# Patient Record
Sex: Male | Born: 1965 | Race: Black or African American | Hispanic: No | Smoking: Current every day smoker
Health system: Southern US, Community
[De-identification: ages and names within clinical notes are randomized; demographics above are authoritative.]

## PROBLEM LIST (undated history)

## (undated) DIAGNOSIS — G709 Myoneural disorder, unspecified: Secondary | ICD-10-CM

## (undated) DIAGNOSIS — E785 Hyperlipidemia, unspecified: Secondary | ICD-10-CM

## (undated) DIAGNOSIS — M19011 Primary osteoarthritis, right shoulder: Secondary | ICD-10-CM

## (undated) DIAGNOSIS — M1990 Unspecified osteoarthritis, unspecified site: Secondary | ICD-10-CM

## (undated) DIAGNOSIS — M549 Dorsalgia, unspecified: Secondary | ICD-10-CM

## (undated) DIAGNOSIS — G4730 Sleep apnea, unspecified: Secondary | ICD-10-CM

## (undated) DIAGNOSIS — K219 Gastro-esophageal reflux disease without esophagitis: Secondary | ICD-10-CM

## (undated) DIAGNOSIS — G43909 Migraine, unspecified, not intractable, without status migrainosus: Secondary | ICD-10-CM

## (undated) DIAGNOSIS — F329 Major depressive disorder, single episode, unspecified: Secondary | ICD-10-CM

## (undated) DIAGNOSIS — F419 Anxiety disorder, unspecified: Secondary | ICD-10-CM

## (undated) DIAGNOSIS — F258 Other schizoaffective disorders: Secondary | ICD-10-CM

## (undated) DIAGNOSIS — J45909 Unspecified asthma, uncomplicated: Secondary | ICD-10-CM

## (undated) DIAGNOSIS — F99 Mental disorder, not otherwise specified: Secondary | ICD-10-CM

## (undated) DIAGNOSIS — I10 Essential (primary) hypertension: Secondary | ICD-10-CM

## (undated) DIAGNOSIS — J302 Other seasonal allergic rhinitis: Secondary | ICD-10-CM

## (undated) DIAGNOSIS — F259 Schizoaffective disorder, unspecified: Secondary | ICD-10-CM

## (undated) DIAGNOSIS — F32A Depression, unspecified: Secondary | ICD-10-CM

## (undated) HISTORY — DX: Unspecified asthma, uncomplicated: J45.909

## (undated) HISTORY — DX: Migraine, unspecified, not intractable, without status migrainosus: G43.909

## (undated) HISTORY — DX: Mental disorder, not otherwise specified: F99

## (undated) HISTORY — DX: Other schizoaffective disorders: F25.8

## (undated) HISTORY — DX: Major depressive disorder, single episode, unspecified: F32.9

## (undated) HISTORY — DX: Depression, unspecified: F32.A

## (undated) HISTORY — PX: CARPAL TUNNEL RELEASE: SHX101

## (undated) HISTORY — DX: Schizoaffective disorder, unspecified: F25.9

## (undated) HISTORY — PX: SHOULDER ARTHROSCOPY W/ ROTATOR CUFF REPAIR: SHX2400

---
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
## 1998-01-20 HISTORY — PX: BACK SURGERY: SHX140

## 1999-04-17 ENCOUNTER — Ambulatory Visit (HOSPITAL_COMMUNITY): Admission: RE | Admit: 1999-04-17 | Discharge: 1999-04-17 | Payer: Self-pay | Admitting: Specialist

## 1999-04-17 ENCOUNTER — Encounter: Payer: Self-pay | Admitting: Specialist

## 1999-05-08 ENCOUNTER — Encounter: Admission: RE | Admit: 1999-05-08 | Discharge: 1999-08-06 | Payer: Self-pay | Admitting: Anesthesiology

## 1999-06-05 ENCOUNTER — Encounter: Payer: Self-pay | Admitting: Specialist

## 1999-06-05 ENCOUNTER — Observation Stay (HOSPITAL_COMMUNITY): Admission: RE | Admit: 1999-06-05 | Discharge: 1999-06-06 | Payer: Self-pay | Admitting: Specialist

## 2000-03-25 ENCOUNTER — Encounter: Admission: RE | Admit: 2000-03-25 | Discharge: 2000-03-25 | Payer: Self-pay | Admitting: Specialist

## 2000-03-25 ENCOUNTER — Encounter: Payer: Self-pay | Admitting: Specialist

## 2000-07-03 ENCOUNTER — Emergency Department (HOSPITAL_COMMUNITY): Admission: EM | Admit: 2000-07-03 | Discharge: 2000-07-03 | Payer: Self-pay | Admitting: Emergency Medicine

## 2000-07-03 ENCOUNTER — Encounter: Payer: Self-pay | Admitting: Emergency Medicine

## 2008-01-21 HISTORY — PX: SHOULDER ARTHROSCOPY W/ ROTATOR CUFF REPAIR: SHX2400

## 2012-01-07 ENCOUNTER — Other Ambulatory Visit: Payer: Self-pay | Admitting: Orthopedic Surgery

## 2012-01-08 ENCOUNTER — Encounter (HOSPITAL_BASED_OUTPATIENT_CLINIC_OR_DEPARTMENT_OTHER): Payer: Self-pay | Admitting: *Deleted

## 2012-01-08 NOTE — Progress Notes (Signed)
Pt poor histiorian-sees dr [facility], now [facility],had surgery [facility] 2010- [facility], called  hospital Called [facility] To bring all meds, overnight bag, cpap

## 2012-01-12 ENCOUNTER — Ambulatory Visit (HOSPITAL_BASED_OUTPATIENT_CLINIC_OR_DEPARTMENT_OTHER): Payer: Medicare Other | Admitting: Anesthesiology

## 2012-01-12 ENCOUNTER — Encounter (HOSPITAL_BASED_OUTPATIENT_CLINIC_OR_DEPARTMENT_OTHER)
Admission: RE | Disposition: A | Discharge status: Home / Self Care | Payer: Self-pay | Source: Ambulatory Visit | Attending: Orthopedic Surgery

## 2012-01-12 ENCOUNTER — Ambulatory Visit (HOSPITAL_BASED_OUTPATIENT_CLINIC_OR_DEPARTMENT_OTHER)
Admission: RE | Admit: 2012-01-12 | Discharge: 2012-01-12 | Disposition: A | Discharge status: Home / Self Care | Payer: Medicare Other | Source: Ambulatory Visit | Attending: Orthopedic Surgery | Admitting: Orthopedic Surgery

## 2012-01-12 ENCOUNTER — Encounter (HOSPITAL_BASED_OUTPATIENT_CLINIC_OR_DEPARTMENT_OTHER): Payer: Self-pay | Admitting: Anesthesiology

## 2012-01-12 ENCOUNTER — Encounter (HOSPITAL_BASED_OUTPATIENT_CLINIC_OR_DEPARTMENT_OTHER): Payer: Self-pay | Admitting: Certified Registered"

## 2012-01-12 DIAGNOSIS — M129 Arthropathy, unspecified: Secondary | ICD-10-CM | POA: Insufficient documentation

## 2012-01-12 DIAGNOSIS — F411 Generalized anxiety disorder: Secondary | ICD-10-CM | POA: Insufficient documentation

## 2012-01-12 DIAGNOSIS — I10 Essential (primary) hypertension: Secondary | ICD-10-CM | POA: Insufficient documentation

## 2012-01-12 DIAGNOSIS — M719 Bursopathy, unspecified: Secondary | ICD-10-CM | POA: Insufficient documentation

## 2012-01-12 DIAGNOSIS — M67919 Unspecified disorder of synovium and tendon, unspecified shoulder: Secondary | ICD-10-CM | POA: Insufficient documentation

## 2012-01-12 DIAGNOSIS — G4730 Sleep apnea, unspecified: Secondary | ICD-10-CM | POA: Insufficient documentation

## 2012-01-12 DIAGNOSIS — K219 Gastro-esophageal reflux disease without esophagitis: Secondary | ICD-10-CM | POA: Insufficient documentation

## 2012-01-12 DIAGNOSIS — E785 Hyperlipidemia, unspecified: Secondary | ICD-10-CM | POA: Insufficient documentation

## 2012-01-12 DIAGNOSIS — M75100 Unspecified rotator cuff tear or rupture of unspecified shoulder, not specified as traumatic: Secondary | ICD-10-CM

## 2012-01-12 HISTORY — DX: Dorsalgia, unspecified: M54.9

## 2012-01-12 HISTORY — DX: Myoneural disorder, unspecified: G70.9

## 2012-01-12 HISTORY — DX: Hyperlipidemia, unspecified: E78.5

## 2012-01-12 HISTORY — DX: Other seasonal allergic rhinitis: J30.2

## 2012-01-12 HISTORY — DX: Gastro-esophageal reflux disease without esophagitis: K21.9

## 2012-01-12 HISTORY — DX: Sleep apnea, unspecified: G47.30

## 2012-01-12 HISTORY — DX: Unspecified osteoarthritis, unspecified site: M19.90

## 2012-01-12 HISTORY — DX: Essential (primary) hypertension: I10

## 2012-01-12 HISTORY — PX: SHOULDER ARTHROSCOPY WITH ROTATOR CUFF REPAIR: SHX5685

## 2012-01-12 HISTORY — DX: Anxiety disorder, unspecified: F41.9

## 2012-01-12 LAB — POCT I-STAT, CHEM 8
BUN: 10 mg/dL (ref 6–23)
Creatinine, Ser: 0.7 mg/dL (ref 0.50–1.35)
Potassium: 4.1 mEq/L (ref 3.5–5.1)
Sodium: 141 mEq/L (ref 135–145)

## 2012-01-12 SURGERY — ARTHROSCOPY, SHOULDER, WITH ROTATOR CUFF REPAIR
Anesthesia: Regional | Site: Shoulder | Laterality: Right | Wound class: Clean

## 2012-01-12 MED ORDER — SUCCINYLCHOLINE CHLORIDE 20 MG/ML IJ SOLN
INTRAMUSCULAR | Status: DC | PRN
  Administered 2012-01-12: 100 mg via INTRAVENOUS

## 2012-01-12 MED ORDER — FENTANYL CITRATE 0.05 MG/ML IJ SOLN
INTRAMUSCULAR | Status: DC | PRN
  Administered 2012-01-12: 50 ug via INTRAVENOUS

## 2012-01-12 MED ORDER — LIDOCAINE HCL (CARDIAC) 20 MG/ML IV SOLN
INTRAVENOUS | Status: DC | PRN
  Administered 2012-01-12: 50 mg via INTRAVENOUS

## 2012-01-12 MED ORDER — FENTANYL CITRATE 0.05 MG/ML IJ SOLN
50.0000 ug | INTRAMUSCULAR | Status: DC | PRN
  Administered 2012-01-12: 100 ug via INTRAVENOUS

## 2012-01-12 MED ORDER — OXYCODONE HCL 5 MG/5ML PO SOLN: 5.0000 mg | Freq: Once | ORAL | Status: DC | PRN

## 2012-01-12 MED ORDER — LACTATED RINGERS IV SOLN
INTRAVENOUS | Status: DC
  Administered 2012-01-12 (×2): via INTRAVENOUS

## 2012-01-12 MED ORDER — BUPIVACAINE-EPINEPHRINE PF 0.5-1:200000 % IJ SOLN
INTRAMUSCULAR | Status: DC | PRN
  Administered 2012-01-12: 30 mL

## 2012-01-12 MED ORDER — MIDAZOLAM HCL 2 MG/2ML IJ SOLN
0.5000 mg | INTRAMUSCULAR | Status: DC | PRN
  Administered 2012-01-12: 2 mg via INTRAVENOUS

## 2012-01-12 MED ORDER — OXYCODONE-ACETAMINOPHEN 5-325 MG PO TABS: 1.0000 | ORAL_TABLET | ORAL | Status: DC | PRN

## 2012-01-12 MED ORDER — LACTATED RINGERS IV SOLN: INTRAVENOUS | Status: DC

## 2012-01-12 MED ORDER — PROPOFOL 10 MG/ML IV BOLUS
INTRAVENOUS | Status: DC | PRN
  Administered 2012-01-12: 200 mg via INTRAVENOUS

## 2012-01-12 MED ORDER — CEFAZOLIN SODIUM-DEXTROSE 2-3 GM-% IV SOLR
INTRAVENOUS | Status: DC | PRN
  Administered 2012-01-12: 2 g via INTRAVENOUS

## 2012-01-12 MED ORDER — HYDROMORPHONE HCL PF 1 MG/ML IJ SOLN: 0.2500 mg | INTRAMUSCULAR | Status: DC | PRN

## 2012-01-12 MED ORDER — DEXAMETHASONE SODIUM PHOSPHATE 4 MG/ML IJ SOLN
INTRAMUSCULAR | Status: DC | PRN
  Administered 2012-01-12: 4 mg via INTRAVENOUS

## 2012-01-12 MED ORDER — ONDANSETRON HCL 4 MG/2ML IJ SOLN
INTRAMUSCULAR | Status: DC | PRN
  Administered 2012-01-12: 4 mg via INTRAVENOUS

## 2012-01-12 MED ORDER — OXYCODONE HCL 5 MG PO TABS: 5.0000 mg | ORAL_TABLET | Freq: Once | ORAL | Status: DC | PRN

## 2012-01-12 SURGICAL SUPPLY — 80 items
ADH SKN CLS APL DERMABOND .7 (GAUZE/BANDAGES/DRESSINGS)
APL SKNCLS STERI-STRIP NONHPOA (GAUZE/BANDAGES/DRESSINGS)
BENZOIN TINCTURE PRP APPL 2/3 (GAUZE/BANDAGES/DRESSINGS) IMPLANT
BLADE SURG 15 STRL LF DISP TIS (BLADE) IMPLANT
BLADE SURG 15 STRL SS (BLADE)
BLADE SURG ROTATE 9660 (MISCELLANEOUS) IMPLANT
BUR OVAL 4.0 (BURR) ×2 IMPLANT
CANISTER OMNI JUG 16 LITER (MISCELLANEOUS) ×2 IMPLANT
CANISTER SUCTION 2500CC (MISCELLANEOUS) IMPLANT
CANNULA 5.75X71 LONG (CANNULA) ×2 IMPLANT
CANNULA TWIST IN 8.25X7CM (CANNULA) IMPLANT
CHLORAPREP W/TINT 26ML (MISCELLANEOUS) ×2 IMPLANT
CLOTH BEACON ORANGE TIMEOUT ST (SAFETY) ×2 IMPLANT
DECANTER SPIKE VIAL GLASS SM (MISCELLANEOUS) IMPLANT
DERMABOND ADVANCED (GAUZE/BANDAGES/DRESSINGS)
DERMABOND ADVANCED .7 DNX12 (GAUZE/BANDAGES/DRESSINGS) IMPLANT
DRAPE INCISE IOBAN 66X45 STRL (DRAPES) ×2 IMPLANT
DRAPE STERI 35X30 U-POUCH (DRAPES) ×2 IMPLANT
DRAPE SURG 17X23 STRL (DRAPES) ×2 IMPLANT
DRAPE U 20/CS (DRAPES) ×2 IMPLANT
DRAPE U-SHAPE 47X51 STRL (DRAPES) ×2 IMPLANT
DRAPE U-SHAPE 76X120 STRL (DRAPES) ×4 IMPLANT
DRSG PAD ABDOMINAL 8X10 ST (GAUZE/BANDAGES/DRESSINGS) ×2 IMPLANT
ELECT REM PT RETURN 9FT ADLT (ELECTROSURGICAL) ×2
ELECTRODE REM PT RTRN 9FT ADLT (ELECTROSURGICAL) ×1 IMPLANT
GAUZE SPONGE 4X4 16PLY XRAY LF (GAUZE/BANDAGES/DRESSINGS) IMPLANT
GAUZE XEROFORM 1X8 LF (GAUZE/BANDAGES/DRESSINGS) ×2 IMPLANT
GLOVE BIO SURGEON STRL SZ7 (GLOVE) ×2 IMPLANT
GLOVE BIO SURGEON STRL SZ7.5 (GLOVE) ×3 IMPLANT
GLOVE BIOGEL M 7.0 STRL (GLOVE) ×1 IMPLANT
GLOVE BIOGEL PI IND STRL 7.0 (GLOVE) ×1 IMPLANT
GLOVE BIOGEL PI IND STRL 7.5 (GLOVE) IMPLANT
GLOVE BIOGEL PI IND STRL 8 (GLOVE) ×2 IMPLANT
GLOVE BIOGEL PI INDICATOR 7.0 (GLOVE) ×1
GLOVE BIOGEL PI INDICATOR 7.5 (GLOVE) ×1
GLOVE BIOGEL PI INDICATOR 8 (GLOVE) ×1
GOWN PREVENTION PLUS XLARGE (GOWN DISPOSABLE) ×3 IMPLANT
NDL 1/2 CIR CATGUT .05X1.09 (NEEDLE) IMPLANT
NDL SCORPION MULTI FIRE (NEEDLE) IMPLANT
NDL SUT 6 .5 CRC .975X.05 MAYO (NEEDLE) IMPLANT
NEEDLE 1/2 CIR CATGUT .05X1.09 (NEEDLE) IMPLANT
NEEDLE MAYO TAPER (NEEDLE)
NEEDLE SCORPION MULTI FIRE (NEEDLE) IMPLANT
NS IRRIG 1000ML POUR BTL (IV SOLUTION) IMPLANT
PACK ARTHROSCOPY DSU (CUSTOM PROCEDURE TRAY) ×2 IMPLANT
PACK BASIN DAY SURGERY FS (CUSTOM PROCEDURE TRAY) ×2 IMPLANT
PENCIL BUTTON HOLSTER BLD 10FT (ELECTRODE) IMPLANT
RESECTOR FULL RADIUS 4.2MM (BLADE) ×2 IMPLANT
SLEEVE SCD COMPRESS KNEE MED (MISCELLANEOUS) ×2 IMPLANT
SLING ARM FOAM STRAP LRG (SOFTGOODS) IMPLANT
SLING ARM FOAM STRAP MED (SOFTGOODS) IMPLANT
SLING ARM FOAM STRAP XLG (SOFTGOODS) IMPLANT
SLING ARM IMMOBILIZER MED (SOFTGOODS) IMPLANT
SPONGE GAUZE 4X4 12PLY (GAUZE/BANDAGES/DRESSINGS) ×2 IMPLANT
SPONGE LAP 4X18 X RAY DECT (DISPOSABLE) IMPLANT
STRIP CLOSURE SKIN 1/2X4 (GAUZE/BANDAGES/DRESSINGS) IMPLANT
SUCTION FRAZIER TIP 10 FR DISP (SUCTIONS) IMPLANT
SUPPORT WRAP ARM LG (MISCELLANEOUS) IMPLANT
SUT 2 FIBERLOOP 20 STRT BLUE (SUTURE)
SUT BONE WAX W31G (SUTURE) IMPLANT
SUT ETHILON 3 0 PS 1 (SUTURE) ×2 IMPLANT
SUT FIBERWIRE #2 38 T-5 BLUE (SUTURE)
SUT MNCRL AB 3-0 PS2 18 (SUTURE) IMPLANT
SUT MNCRL AB 4-0 PS2 18 (SUTURE) IMPLANT
SUT PDS AB 0 CT 36 (SUTURE) IMPLANT
SUT PROLENE 3 0 PS 2 (SUTURE) IMPLANT
SUT VIC AB 0 CT1 18XCR BRD 8 (SUTURE) IMPLANT
SUT VIC AB 0 CT1 8-18 (SUTURE)
SUT VIC AB 2-0 SH 18 (SUTURE) IMPLANT
SUTURE 2 FIBERLOOP 20 STRT BLU (SUTURE) IMPLANT
SUTURE FIBERWR #2 38 T-5 BLUE (SUTURE) IMPLANT
SYR BULB 3OZ (MISCELLANEOUS) IMPLANT
TOWEL OR 17X24 6PK STRL BLUE (TOWEL DISPOSABLE) ×2 IMPLANT
TOWEL OR NON WOVEN STRL DISP B (DISPOSABLE) ×2 IMPLANT
TUBE CONNECTING 20X1/4 (TUBING) ×2 IMPLANT
TUBING ARTHROSCOPY IRRIG 16FT (MISCELLANEOUS) ×2 IMPLANT
WAND SHORT BEVEL W/CORD (SURGICAL WAND) IMPLANT
WAND STAR VAC 90 (SURGICAL WAND) ×2 IMPLANT
WATER STERILE IRR 1000ML POUR (IV SOLUTION) ×1 IMPLANT
YANKAUER SUCT BULB TIP NO VENT (SUCTIONS) IMPLANT

## 2012-01-12 NOTE — Anesthesia Preprocedure Evaluation (Signed)
Anesthesia Evaluation  Patient identified by MRN, date of birth, ID band Patient awake    Reviewed: Allergy & Precautions, H&P , NPO status , Patient's Chart, lab work & pertinent test results  Airway Mallampati: II TM Distance: >3 FB Neck ROM: Full    Dental No notable dental hx. (+) Poor Dentition, Dental Advisory Given and Edentulous Lower   Pulmonary sleep apnea and Continuous Positive Airway Pressure Ventilation ,  breath sounds clear to auscultation  Pulmonary exam normal       Cardiovascular hypertension, On Medications Rhythm:Regular Rate:Normal     Neuro/Psych negative neurological ROS  negative psych ROS   GI/Hepatic negative GI ROS, Neg liver ROS, GERD-  Medicated and Controlled,  Endo/Other  Morbid obesity  Renal/GU negative Renal ROS  negative genitourinary   Musculoskeletal   Abdominal   Peds  Hematology negative hematology ROS (+)   Anesthesia Other Findings   Reproductive/Obstetrics negative OB ROS                           Anesthesia Physical Anesthesia Plan  ASA: III  Anesthesia Plan: General and Regional   Post-op Pain Management:    Induction: Intravenous  Airway Management Planned: Oral ETT  Additional Equipment:   Intra-op Plan:   Post-operative Plan: Extubation in OR  Informed Consent: I have reviewed the patients History and Physical, chart, labs and discussed the procedure including the risks, benefits and alternatives for the proposed anesthesia with the patient or authorized representative who has indicated his/her understanding and acceptance.   Dental advisory given  Plan Discussed with: CRNA  Anesthesia Plan Comments:         Anesthesia Quick Evaluation

## 2012-01-12 NOTE — Anesthesia Postprocedure Evaluation (Signed)
  Anesthesia Post-op Note  Patient: Danny Mcdowell  Procedure(s) Performed: Procedure(s) (LRB) with comments: SHOULDER ARTHROSCOPY WITH ROTATOR CUFF REPAIR (Right) - RIGHT SHOULDER ARTHROSCOPIC RIGHT ROTATOR REPAIR DEBRIDEMENT  AND SUBACROMIAL DECOMPRESSION   Patient Location: PACU  Anesthesia Type:GA combined with regional for post-op pain  Level of Consciousness: awake, alert  and oriented  Airway and Oxygen Therapy: Patient Spontanous Breathing and Patient connected to face mask oxygen  Post-op Pain: none  Post-op Assessment: Post-op Vital signs reviewed, Patient's Cardiovascular Status Stable, Respiratory Function Stable, Patent Airway and No signs of Nausea or vomiting  Post-op Vital Signs: Reviewed and stable  Complications: No apparent anesthesia complications

## 2012-01-12 NOTE — Transfer of Care (Signed)
Immediate Anesthesia Transfer of Care Note  Patient: Danny Mcdowell  Procedure(s) Performed: Procedure(s) (LRB) with comments: SHOULDER ARTHROSCOPY WITH ROTATOR CUFF REPAIR (Right) - RIGHT SHOULDER ARTHROSCOPIC RIGHT ROTATOR REPAIR DEBRIDEMENT  AND SUBACROMIAL DECOMPRESSION   Patient Location: PACU  Anesthesia Type:GA combined with regional for post-op pain  Level of Consciousness: awake, alert , oriented and patient cooperative  Airway & Oxygen Therapy: Patient Spontanous Breathing and Patient connected to face mask oxygen  Post-op Assessment: Report given to PACU RN and Post -op Vital signs reviewed and stable  Post vital signs: Reviewed and stable  Complications: No apparent anesthesia complications

## 2012-01-12 NOTE — Progress Notes (Signed)
Assisted Dr. Fitzgerald with right, ultrasound guided, interscalene  block. Side rails up, monitors on throughout procedure. See vital signs in flow sheet. Tolerated Procedure well. 

## 2012-01-12 NOTE — Op Note (Signed)
Procedure(s): SHOULDER ARTHROSCOPY WITH ROTATOR CUFF REPAIR Procedure Note  Danny Mcdowell male 46 y.o. 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         Condition: stable    Procedure:   INDICATIONS FOR SURGERY: The patient is 46 y.o. male who has had a long history of right shoulder problems. He underwent a right shoulder rotator cuff repair back in 2005. He has had a recent exacerbation of his pain with studies which showed recurrent retracted rotator cuff tear with associated atrophy.  He had extensive conservative management with physical therapy and injection therapy when on to have continued pain and mechanical symptoms in the shoulder. He wished to go forward with arthroscopic examination and debridement versus repair of the tear a possible understanding that the tear was not likely repairable. In the case of irreparable tear we also talked about the possibility of a biceps tenotomy for pain relief.  OPERATIVE FINDINGS: Examination under anesthesia: No stiffness or instability. Diagnostic Arthroscopy:  Glenoid articular cartilage: Intact Humeral head articular  cartilage: Intact Labrum: He had some partial elevation of the superior labrum and biceps root. The long head biceps that longitudinal tearing and pulled into the joint. Loose bodies: None Synovitis: Mild Rotator cuff: Supraspinatus was completely torn and retracted to the level of the glenoid. After releases the cuff edge was not mobile enough to come over to the tuberosity for repair. The cuff edge was debrided. Several sutures were removed. The tuberosity was debrided. Coracoacromial ligament: Frayed, underlying large hooked anterior acromion taking down the standard acromioplasty creating a type I acromion.  DESCRIPTION OF PROCEDURE: The patient was identified in preoperative  holding area where I personally marked the operative site after  verifying site, side, and procedure with the patient. An interscalene block was given by the attending anesthesiologist the holding area.  The patient was taken back to the operating room where general anesthesia was induced without complication and was placed in the beach-chair position with the back  elevated about 60 degrees and all extremities and head and neck carefully padded and  positioned.   The right upper extremity was then prepped and  draped in a standard sterile fashion. The appropriate time-out  procedure was carried out. The patient did receive IV antibiotics  within 30 minutes of incision.   A small posterior portal incision was made and the arthroscope was introduced into the joint. An anterior portal was then established above the subscapularis using needle localization. Small cannula was placed anteriorly. Diagnostic arthroscopy was then carried out with findings as described above.  The superior labrum was noted to be elevated off the superior glenoid tubercle. The biceps tendon was pulled in the joint and had a longitudinal tearing which was felt to be a potential source of pain associated with this irreparable rotator cuff tear.  Therefore a large biter was used through the anterior portal to release the long head biceps tendon off of the superior labrum. The superior labrum was then debrided to stable healthy appearing labrum. The subscapularis was intact. The supraspinatus tendon was noted to be torn and retracted to the level of the glenoid. There were a few strands of tissue bridging and Which were not structural and were taken down. The posterior cuff was intact.  The arthroscope was then introduced into the subacromial space a standard lateral portal was established with needle localization. The shaver was used through the lateral portal to perform extensive bursectomy.The coracoacromial arch showed significant fraying indicating chronic impingement in this region.  A capsular release was carried out beneath the supraspinatus above the superior labrum and then the adhesions in the subacromial space were released. A grasper was used to try and grasp the cuff edge. It was determined at this point there is some sutures remaining in the edge of the cuff which removed a large biter. Once back to healthy cuff tissue the grasper was used to try and bring the cuff edge over the tuberosity and it could not be brought even halfway. It was deemed at this point to be irreparable. The tuberosity was debrided.  The coracoacromial ligament was taken down off the anterior acromion with the ArthroCare exposing a large hooked anterior acromial spur. A high-speed bur was then used through the lateral portal to take down the anterior acromial spur from lateral to medial in a standard acromioplasty.  The acromioplasty was also viewed from the lateral portal and the bur was used as necessary in a cutting block fashion to ensure that the acromion was completely flat from posterior to anterior.  The arthroscopic equipment was removed from the joint and the portals were closed with 3-0 nylon in an interrupted fashion. Sterile dressings were then applied  including Xeroform 4 x 4's ABDs and tape. The patient was then allowed to awaken from general anesthesia, placed in a sling, transferred to the stretcher and taken to the recovery room in stable condition.   POSTOPERATIVE PLAN: The patient will be discharged home today and will followup in one week for suture removal and wound check.  He can begin some gentle range of motion exercises when comfortable.

## 2012-01-12 NOTE — H&P (Signed)
Danny Mcdowell is an 46 y.o. male.   Chief Complaint: R shoulder pain HPI: s/p RCR R shoulder in 2005 with recent severe increase in pain.  MRI shows recurrent RCR with atrophy.  Patient failed non op tx with injections and PT, wished to go ahead with arthroscopic debridement for pain relief.  Past Medical History  Diagnosis Date  . Hypertension   . Hyperlipemia   . Arthritis   . Sleep apnea     uses a cpap  . Back pain   . Neuromuscular disorder     numbness lt leg  . Anxiety   . Seasonal allergies   . GERD (gastroesophageal reflux disease)     Past Surgical History  Procedure Date  . Shoulder arthroscopy w/ rotator cuff repair     right x2  . Shoulder arthroscopy w/ rotator cuff repair 2010    left  . Carpal tunnel release     right and left  . Knee arthroscopy 1987    left  . Back surgery 2000    lumb disk    History reviewed. No pertinent family history. Social History:  reports that he has been smoking.  He does not have any smokeless tobacco history on file. He reports that he does not drink alcohol or use illicit drugs.  Allergies: No Known Allergies  Medications Prior to Admission  Medication Sig Dispense Refill  . clonazePAM (KLONOPIN) 0.5 MG tablet Take 0.5 mg by mouth 2 (two) times daily as needed.      . ezetimibe-simvastatin (VYTORIN) 10-20 MG per tablet Take 1 tablet by mouth at bedtime.      . hydrochlorothiazide (HYDRODIURIL) 25 MG tablet Take 25 mg by mouth daily.      Marland Kitchen loratadine (CLARITIN) 10 MG tablet Take 10 mg by mouth daily.      Marland Kitchen omeprazole (PRILOSEC) 20 MG capsule Take 20 mg by mouth 2 (two) times daily.      . traMADol (ULTRAM) 50 MG tablet Take 50 mg by mouth every 6 (six) hours as needed.        No results found for this or any previous visit (from the past 48 hour(s)). No results found.  Review of Systems  All other systems reviewed and are negative.    Blood pressure 126/88, pulse 77, temperature 98.2 F (36.8 C),  temperature source Oral, resp. rate 20, height 5\' 7"  (1.702 m), weight 117.482 kg (259 lb), SpO2 95.00%. Physical Exam  Constitutional: He is oriented to person, place, and time. He appears well-developed and well-nourished.  HENT:  Head: Atraumatic.  Eyes: EOM are normal.  Cardiovascular: Intact distal pulses.   Respiratory: Effort normal.  Musculoskeletal:       Right shoulder: He exhibits decreased range of motion, tenderness and pain.  Neurological: He is alert and oriented to person, place, and time.  Skin: Skin is warm and dry.  Psychiatric: He has a normal mood and affect.     Assessment/Plan R recurrent RCR, with pain and unfavorable acromial anatomy. Plan arth debridement vs repair with acromioplasty Risks / benefits of surgery discussed Consent on chart  NPO for OR Preop antibiotics   Jaquawn Saffran WILLIAM 01/12/2012, 2:43 PM

## 2012-01-12 NOTE — Anesthesia Procedure Notes (Addendum)
Anesthesia Regional Block:  Interscalene brachial plexus block  Pre-Anesthetic Checklist: ,, timeout performed, Correct Patient, Correct Site, Correct Laterality, Correct Procedure, Correct Position, site marked, Risks and benefits discussed, pre-op evaluation,  At surgeon's request and post-op pain management  Laterality: Right  Prep: Maximum Sterile Barrier Precautions used and chloraprep       Needles:  Injection technique: Single-shot  Needle Type: Echogenic Stimulator Needle     Needle Length: 5cm 5 cm Needle Gauge: 22 and 22 G    Additional Needles:  Procedures: ultrasound guided (picture in chart) and nerve stimulator Interscalene brachial plexus block  Nerve Stimulator or Paresthesia:  Response: Biceps response,   Additional Responses:   Narrative:  Start time: 01/12/2012 3:00 PM End time: 01/12/2012 3:13 PM Injection made incrementally with aspirations every 5 mL. Anesthesiologist: Sampson Goon, MD  Additional Notes: 2% Lidocaine skin wheel.   Interscalene brachial plexus block Procedure Name: Intubation Date/Time: 01/12/2012 3:25 PM Performed by: Zenia Resides D Pre-anesthesia Checklist: Patient identified, Emergency Drugs available, Suction available and Patient being monitored Patient Re-evaluated:Patient Re-evaluated prior to inductionOxygen Delivery Method: Circle System Utilized Preoxygenation: Pre-oxygenation with 100% oxygen Intubation Type: IV induction Ventilation: Mask ventilation without difficulty and Oral airway inserted - appropriate to patient size Grade View: Grade II Tube type: Oral Tube size: 8.0 mm Number of attempts: 1 Airway Equipment and Method: stylet,  oral airway and Video-laryngoscopy Placement Confirmation: ETT inserted through vocal cords under direct vision,  positive ETCO2 and breath sounds checked- equal and bilateral Secured at: 24 cm Tube secured with: Tape Dental Injury: Teeth and Oropharynx as per pre-operative assessment   Difficulty Due To: Difficult Airway- due to dentition

## 2012-01-16 ENCOUNTER — Encounter (HOSPITAL_BASED_OUTPATIENT_CLINIC_OR_DEPARTMENT_OTHER): Payer: Self-pay | Admitting: Orthopedic Surgery

## 2013-01-20 HISTORY — PX: NOSE SURGERY: SHX723

## 2015-06-27 ENCOUNTER — Encounter: Payer: Self-pay | Admitting: Emergency Medicine

## 2015-06-27 ENCOUNTER — Emergency Department
Admission: EM | Admit: 2015-06-27 | Discharge: 2015-06-27 | Disposition: A | Discharge status: Home / Self Care | Payer: Medicare Other | Attending: Emergency Medicine | Admitting: Emergency Medicine

## 2015-06-27 ENCOUNTER — Emergency Department: Payer: Medicare Other

## 2015-06-27 DIAGNOSIS — F17200 Nicotine dependence, unspecified, uncomplicated: Secondary | ICD-10-CM | POA: Insufficient documentation

## 2015-06-27 DIAGNOSIS — R109 Unspecified abdominal pain: Secondary | ICD-10-CM | POA: Insufficient documentation

## 2015-06-27 DIAGNOSIS — I10 Essential (primary) hypertension: Secondary | ICD-10-CM | POA: Insufficient documentation

## 2015-06-27 DIAGNOSIS — M1990 Unspecified osteoarthritis, unspecified site: Secondary | ICD-10-CM | POA: Insufficient documentation

## 2015-06-27 DIAGNOSIS — Z79899 Other long term (current) drug therapy: Secondary | ICD-10-CM | POA: Insufficient documentation

## 2015-06-27 DIAGNOSIS — E785 Hyperlipidemia, unspecified: Secondary | ICD-10-CM | POA: Diagnosis not present

## 2015-06-27 DIAGNOSIS — K4090 Unilateral inguinal hernia, without obstruction or gangrene, not specified as recurrent: Secondary | ICD-10-CM | POA: Diagnosis not present

## 2015-06-27 LAB — CBC
HCT: 39.4 % — ABNORMAL LOW (ref 40.0–52.0)
Hemoglobin: 13.6 g/dL (ref 13.0–18.0)
MCH: 30.8 pg (ref 26.0–34.0)
MCHC: 34.6 g/dL (ref 32.0–36.0)
MCV: 89 fL (ref 80.0–100.0)
Platelets: 259 10*3/uL (ref 150–440)
RBC: 4.43 MIL/uL (ref 4.40–5.90)
RDW: 13.9 % (ref 11.5–14.5)
WBC: 6.6 10*3/uL (ref 3.8–10.6)

## 2015-06-27 LAB — COMPREHENSIVE METABOLIC PANEL
ALT: 17 U/L (ref 17–63)
AST: 21 U/L (ref 15–41)
Albumin: 4.3 g/dL (ref 3.5–5.0)
Alkaline Phosphatase: 39 U/L (ref 38–126)
Anion gap: 5 (ref 5–15)
BILIRUBIN TOTAL: 0.6 mg/dL (ref 0.3–1.2)
BUN: 16 mg/dL (ref 6–20)
CALCIUM: 9.1 mg/dL (ref 8.9–10.3)
CHLORIDE: 107 mmol/L (ref 101–111)
CO2: 25 mmol/L (ref 22–32)
CREATININE: 0.95 mg/dL (ref 0.61–1.24)
Glucose, Bld: 101 mg/dL — ABNORMAL HIGH (ref 65–99)
Potassium: 4.1 mmol/L (ref 3.5–5.1)
Sodium: 137 mmol/L (ref 135–145)
TOTAL PROTEIN: 7.6 g/dL (ref 6.5–8.1)

## 2015-06-27 LAB — URINALYSIS COMPLETE WITH MICROSCOPIC (ARMC ONLY)
Bacteria, UA: NONE SEEN
Bilirubin Urine: NEGATIVE
Glucose, UA: NEGATIVE mg/dL
Hgb urine dipstick: NEGATIVE
Ketones, ur: NEGATIVE mg/dL
Leukocytes, UA: NEGATIVE
Nitrite: NEGATIVE
Protein, ur: NEGATIVE mg/dL
Specific Gravity, Urine: 1.02 (ref 1.005–1.030)
Squamous Epithelial / LPF: NONE SEEN
WBC, UA: NONE SEEN WBC/hpf (ref 0–5)
pH: 6 (ref 5.0–8.0)

## 2015-06-27 LAB — LIPASE, BLOOD: LIPASE: 20 U/L (ref 11–51)

## 2015-06-27 MED ORDER — IBUPROFEN 800 MG PO TABS: 800.0000 mg | ORAL_TABLET | Freq: Three times a day (TID) | ORAL | Status: DC | PRN

## 2015-06-27 MED ORDER — CYCLOBENZAPRINE HCL 10 MG PO TABS: 10.0000 mg | ORAL_TABLET | Freq: Three times a day (TID) | ORAL | Status: DC | PRN

## 2015-06-27 MED ORDER — OXYCODONE-ACETAMINOPHEN 5-325 MG PO TABS
2.0000 | ORAL_TABLET | Freq: Once | ORAL | Status: AC
  Administered 2015-06-27: 2 via ORAL
  Filled 2015-06-27: qty 2

## 2015-06-27 NOTE — ED Notes (Signed)
Patient presents to the ED with intermittent right side/abdominal pain for the past 5-6 hours.  Patient reports history of similar pain that he states was caused by kidney problems.  Patient reports slight pain on the left flank but much worse pain in his right side.  Patient denies nausea, vomiting, and diarrhea.  Patient states pain was somewhat worse after he ate lunch of barbecue.  Patient is in no obvious distress in triage at this time.

## 2015-06-27 NOTE — Discharge Instructions (Signed)
Flank Pain °Flank pain refers to pain that is located on the side of the body between the upper abdomen and the back. The pain may occur over a short period of time (acute) or may be long-term or reoccurring (chronic). It may be mild or severe. Flank pain can be caused by many things. °CAUSES  °Some of the more common causes of flank pain include: °· Muscle strains.   °· Muscle spasms.   °· A disease of your spine (vertebral disk disease).   °· A lung infection (pneumonia).   °· Fluid around your lungs (pulmonary edema).   °· A kidney infection.   °· Kidney stones.   °· A very painful skin rash caused by the chickenpox virus (shingles).   °· Gallbladder disease.   °HOME CARE INSTRUCTIONS  °Home care will depend on the cause of your pain. In general, °· Rest as directed by your caregiver. °· Drink enough fluids to keep your urine clear or pale yellow. °· Only take over-the-counter or prescription medicines as directed by your caregiver. Some medicines may help relieve the pain. °· Tell your caregiver about any changes in your pain. °· Follow up with your caregiver as directed. °SEEK IMMEDIATE MEDICAL CARE IF:  °· Your pain is not controlled with medicine.   °· You have new or worsening symptoms. °· Your pain increases.   °· You have abdominal pain.   °· You have shortness of breath.   °· You have persistent nausea or vomiting.   °· You have swelling in your abdomen.   °· You feel faint or pass out.   °· You have blood in your urine. °· You have a fever or persistent symptoms for more than 2-3 days. °· You have a fever and your symptoms suddenly get worse. °MAKE SURE YOU:  °· Understand these instructions. °· Will watch your condition. °· Will get help right away if you are not doing well or get worse. °  °This information is not intended to replace advice given to you by your health care provider. Make sure you discuss any questions you have with your health care provider. °  °Document Released: 02/27/2005 Document  Revised: 10/01/2011 Document Reviewed: 08/21/2011 °Elsevier Interactive Patient Education ©2016 [employer] ° °

## 2015-06-27 NOTE — ED Provider Notes (Signed)
[facility] Emergency Department Provider Note        Time seen: ----------------------------------------- 6:44 PM on 06/27/2015 -----------------------------------------    I have reviewed the triage vital signs and the nursing notes.   HISTORY  Chief Complaint Abdominal Pain    HPI Danny Mcdowell is a 50 y.o. male who presents to ER for right-sided flank pain for last 5-6 hours. Patient reports he has a history of same that has been bothering over the last several years that was caused by kidney problems. Occasionally he said pain in the left side but now he has more pain in the right side. Denies nausea, vomiting or diarrhea. Patient states the pain seemed to get worse after a barbecue.   Past Medical History  Diagnosis Date  . Hypertension   . Hyperlipemia   . Arthritis   . Sleep apnea     uses a cpap  . Back pain   . Neuromuscular disorder (HCC)     numbness lt leg  . Anxiety   . Seasonal allergies   . GERD (gastroesophageal reflux disease)     There are no active problems to display for this patient.   Past Surgical History  Procedure Laterality Date  . Shoulder arthroscopy w/ rotator cuff repair      right x2  . Shoulder arthroscopy w/ rotator cuff repair  2010    left  . Carpal tunnel release      right and left  . Knee arthroscopy  1987    left  . Back surgery  2000    lumb disk  . Shoulder arthroscopy with rotator cuff repair  01/12/2012    Procedure: SHOULDER ARTHROSCOPY WITH ROTATOR CUFF REPAIR;  Surgeon: Mable ParisJustin William Chandler, MD;  Location: [facility];  Service: Orthopedics;  Laterality: Right;  RIGHT SHOULDER ARTHROSCOPIC RIGHT ROTATOR REPAIR DEBRIDEMENT  AND SUBACROMIAL DECOMPRESSION     Allergies Review of patient's allergies indicates no known allergies.  Social History Social History  Substance Use Topics  . Smoking status: Current Every Day Smoker -- 1.00 packs/day  . Smokeless tobacco:  None  . Alcohol Use: No     Comment: not since 2010    Review of Systems Constitutional: Negative for fever. Eyes: Negative for visual changes. ENT: Negative for sore throat. Cardiovascular: Negative for chest pain. Respiratory: Negative for shortness of breath. Gastrointestinal: Positive for flank pain Genitourinary: Negative for dysuria. Musculoskeletal: Negative for back pain. Skin: Negative for rash. Neurological: Negative for headaches, focal weakness or numbness.  10-point ROS otherwise negative.  ____________________________________________   PHYSICAL EXAM:  VITAL SIGNS: ED Triage Vitals  Enc Vitals Group     BP 06/27/15 1717 130/83 mmHg     Pulse Rate 06/27/15 1717 72     Resp 06/27/15 1717 18     Temp 06/27/15 1717 98.8 F (37.1 C)     Temp Source 06/27/15 1717 Oral     SpO2 06/27/15 1717 99 %     Weight 06/27/15 1717 230 lb (104.327 kg)     Height 06/27/15 1717 5\' 7"  (1.702 m)     Head Cir --      Peak Flow --      Pain Score 06/27/15 1718 8     Pain Loc --      Pain Edu? --      Excl. in GC? --     Constitutional: Alert and oriented. Well appearing and in no distress. Eyes: Conjunctivae are normal. PERRL.  Normal extraocular movements. ENT   Head: Normocephalic and atraumatic.   Nose: No congestion/rhinnorhea.   Mouth/Throat: Mucous membranes are moist.   Neck: No stridor. Cardiovascular: Normal rate, regular rhythm. No murmurs, rubs, or gallops. Respiratory: Normal respiratory effort without tachypnea nor retractions. Breath sounds are clear and equal bilaterally. No wheezes/rales/rhonchi. Gastrointestinal:  Right flank tenderness, no rebound or guarding. Normal bowel sounds.  Musculoskeletal: Nontender with normal range of motion in all extremities. No lower extremity tenderness nor edema. Neurologic:  Normal speech and language. No gross focal neurologic deficits are appreciated.  Skin:  Skin is warm, dry and intact. No rash  noted. Psychiatric: Mood and affect are normal. Speech and behavior are normal.  ____________________________________________  ED COURSE:  Pertinent labs & imaging results that were available during my care of the patient were reviewed by me and considered in my medical decision making (see chart for details).  patient is in no acute distress, will check basic labs and reevaluate.  ____________________________________________    LABS (pertinent positives/negatives)  Labs Reviewed  COMPREHENSIVE METABOLIC PANEL - Abnormal; Notable for the following:    Glucose, Bld 101 (*)    All other components within normal limits  CBC - Abnormal; Notable for the following:    HCT 39.4 (*)    All other components within normal limits  URINALYSIS COMPLETEWITH MICROSCOPIC ([facility] ONLY) - Abnormal; Notable for the following:    Color, Urine YELLOW (*)    APPearance CLEAR (*)    All other components within normal limits  LIPASE, BLOOD    RADIOLOGY Images were view  CT renal protocol  IMPRESSION: Negative for urinary tract stone. No acute abnormality or finding to explain the patient's symptoms.  Small fat containing right inguinal hernia.  Mild, scattered aortic atherosclerosis. ____________________________________________  FINAL ASSESSMENT AND PLAN:   Flank pain  Plan: Patient with labs and imaging as dictated above. No clear etiology for his pain is identified. He'll be discharged with Motrin and Flexeril and encouraged to have close follow-up with [facility].   Emily Filbert, MD   Note: This dictation was prepared with Dragon dictation. Any transcriptional errors that result from this process are unintentional   Emily Filbert, MD 06/27/15 [phone]

## 2015-06-29 ENCOUNTER — Telehealth: Payer: Self-pay | Admitting: *Deleted

## 2015-06-29 NOTE — Telephone Encounter (Signed)
Pt not available at time of call. He will try to call back this afternoon if he is able.

## 2015-07-02 ENCOUNTER — Ambulatory Visit (INDEPENDENT_AMBULATORY_CARE_PROVIDER_SITE_OTHER): Payer: Medicare Other | Admitting: Physician Assistant

## 2015-07-02 ENCOUNTER — Encounter: Payer: Self-pay | Admitting: Physician Assistant

## 2015-07-02 VITALS — BP 118/80 | HR 65 | Temp 97.8°F | Resp 16 | Ht 67.0 in | Wt 235.4 lb

## 2015-07-02 DIAGNOSIS — I10 Essential (primary) hypertension: Secondary | ICD-10-CM

## 2015-07-02 DIAGNOSIS — I700 Atherosclerosis of aorta: Secondary | ICD-10-CM

## 2015-07-02 DIAGNOSIS — R109 Unspecified abdominal pain: Secondary | ICD-10-CM

## 2015-07-02 DIAGNOSIS — E785 Hyperlipidemia, unspecified: Secondary | ICD-10-CM

## 2015-07-02 DIAGNOSIS — R7303 Prediabetes: Secondary | ICD-10-CM

## 2015-07-02 LAB — URINALYSIS, ROUTINE W REFLEX MICROSCOPIC
BILIRUBIN URINE: NEGATIVE
HGB URINE DIPSTICK: NEGATIVE
KETONES UR: NEGATIVE
LEUKOCYTES UA: NEGATIVE
NITRITE: NEGATIVE
PH: 6.5 (ref 5.0–8.0)
RBC / HPF: NONE SEEN (ref 0–?)
Specific Gravity, Urine: 1.02 (ref 1.000–1.030)
TOTAL PROTEIN, URINE-UPE24: NEGATIVE
URINE GLUCOSE: NEGATIVE
UROBILINOGEN UA: 0.2 (ref 0.0–1.0)
WBC, UA: NONE SEEN (ref 0–?)

## 2015-07-02 LAB — COMPREHENSIVE METABOLIC PANEL
ALBUMIN: 3.9 g/dL (ref 3.5–5.2)
ALK PHOS: 32 U/L — AB (ref 39–117)
ALT: 14 U/L (ref 0–53)
AST: 16 U/L (ref 0–37)
BILIRUBIN TOTAL: 0.6 mg/dL (ref 0.2–1.2)
BUN: 15 mg/dL (ref 6–23)
CO2: 25 mEq/L (ref 19–32)
Calcium: 9.2 mg/dL (ref 8.4–10.5)
Chloride: 107 mEq/L (ref 96–112)
Creatinine, Ser: 0.79 mg/dL (ref 0.40–1.50)
GFR: 134.31 mL/min (ref >60.00)
Glucose, Bld: 105 mg/dL — ABNORMAL HIGH (ref 70–99)
POTASSIUM: 4.1 meq/L (ref 3.5–5.1)
SODIUM: 138 meq/L (ref 135–145)
TOTAL PROTEIN: 7.1 g/dL (ref 6.0–8.3)

## 2015-07-02 LAB — LIPID PANEL
CHOLESTEROL: 173 mg/dL (ref 0–200)
HDL: 37.8 mg/dL — ABNORMAL LOW (ref >39.00)
LDL Cholesterol: 113 mg/dL — ABNORMAL HIGH (ref 0–99)
NONHDL: 135.67
Total CHOL/HDL Ratio: 5
Triglycerides: 112 mg/dL (ref 0.0–149.0)
VLDL: 22.4 mg/dL (ref 0.0–40.0)

## 2015-07-02 LAB — HEMOGLOBIN A1C: HEMOGLOBIN A1C: 5.9 % (ref 4.6–6.5)

## 2015-07-02 MED ORDER — BACLOFEN 10 MG PO TABS: 10.0000 mg | ORAL_TABLET | Freq: Three times a day (TID) | ORAL | Status: DC

## 2015-07-02 NOTE — Patient Instructions (Signed)
Please go to the lab for blood work. I will call with results. We will restart medications based on those results.   I will be requesting records from previous providers for review to get a better understanding of your medical history.   Please continue Ibuprofen as directed when needed for pain.  Take the Baclofen for muscle spasm. No driving while on medication.  Limit heavy lifting. Apply topical Icy Hot to the area. A heating pad may also be beneficial. Follow-up with me in 1 week.

## 2015-07-02 NOTE — Progress Notes (Signed)
Patient presents to clinic today to establish care. Patient is an ER follow-up for R flank pain.  Patient presented to ER on 06/27/15 c/o R flank pain x 6 hours. Workup included labs (overall unremarkable), unremarkable UA and CT Renal Stone Study which was negative for renal stone or other finding to explain symptoms. CT did incidentally reveal a small fat containing R inguinal hernia and aortic atherosclerosis (mild) . Since discharge endorses pain is still present but mild and intermittent. Is exacerbated with movement and alleviated with rest. Denies change to bowel or bladder habits. Denies fever, chills, malaise or fatigue. Denies trauma or injury.   Hypertension -- Patient with history of such. Is currently prescribed Chlorthalidone 25 mg. Endorses he only takes occasionally, probably once per week. Denies history of stroke or heart attack. States he did see a Cardiologist previously due to episode of chest pain. States he had a stress test that he thinks was negative. Patient denies chest pain, palpitations, lightheadedness, dizziness, vision changes or frequent headaches.  BP Readings from Last 3 Encounters:  07/02/15 118/80  06/27/15 119/86  01/12/12 125/85   Hyperlipidemia -- Is prescribed Lipitor 20 mg but states he has not taken in > 30 days. Denies side effect of medication. States he just does not like to take pills. Denies current diet or exercise regimen. Body mass index is 36.86 kg/(m^2).  Pre-diabetes -- Has prescription for Metformin 500 mg BID. States he only takes maybe once a week as well. Denies polyuria, polydipsia or polyphagia. States he was told he had higher sugar but was not told he had true diabetes.   GERD -- mild symptoms at present. Only notes heart burn, worse depending on foods. Is taking OTC Prilosec PRN with good relief.   Sleep Apnea -- Endorses wearing CPAP nightly. Endorses good rest.   Past Medical History  Diagnosis Date  . Hypertension   .  Hyperlipemia   . Arthritis   . Sleep apnea     uses a cpap  . Back pain   . Neuromuscular disorder (HCC)     numbness lt leg  . Anxiety   . Seasonal allergies   . GERD (gastroesophageal reflux disease)     Past Surgical History  Procedure Laterality Date  . Shoulder arthroscopy w/ rotator cuff repair      right x2  . Shoulder arthroscopy w/ rotator cuff repair  2010    left  . Carpal tunnel release      right and left  . Knee arthroscopy  1987    left  . Back surgery  2000    lumb disk  . Shoulder arthroscopy with rotator cuff repair  01/12/2012    Procedure: SHOULDER ARTHROSCOPY WITH ROTATOR CUFF REPAIR;  Surgeon: Nita Sells, MD;  Location: [facility];  Service: Orthopedics;  Laterality: Right;  RIGHT SHOULDER ARTHROSCOPIC RIGHT ROTATOR REPAIR DEBRIDEMENT  AND SUBACROMIAL DECOMPRESSION     Current Outpatient Prescriptions on File Prior to Visit  Medication Sig Dispense Refill  . clonazePAM (KLONOPIN) 0.5 MG tablet Take 0.5 mg by mouth 2 (two) times daily as needed.    . cyclobenzaprine (FLEXERIL) 10 MG tablet Take 1 tablet (10 mg total) by mouth every 8 (eight) hours as needed for muscle spasms. 30 tablet 1  . ezetimibe-simvastatin (VYTORIN) 10-20 MG per tablet Take 1 tablet by mouth at bedtime.    . hydrochlorothiazide (HYDRODIURIL) 25 MG tablet Take 25 mg by mouth daily.    Marland Kitchen  ibuprofen (ADVIL,MOTRIN) 800 MG tablet Take 1 tablet (800 mg total) by mouth every 8 (eight) hours as needed. 30 tablet 0  . loratadine (CLARITIN) 10 MG tablet Take 10 mg by mouth daily.    Marland Kitchen omeprazole (PRILOSEC) 20 MG capsule Take 20 mg by mouth 2 (two) times daily.    Marland Kitchen oxyCODONE-acetaminophen (ROXICET) 5-325 MG per tablet Take 1-2 tablets by mouth every 4 (four) hours as needed for pain. 60 tablet 0  . traMADol (ULTRAM) 50 MG tablet Take 50 mg by mouth every 6 (six) hours as needed.     No current facility-administered medications on file prior to visit.    No Known  Allergies  No family history on file.  Social History   Social History  . Marital Status: Married    Spouse Name: N/A  . Number of Children: N/A  . Years of Education: N/A   Occupational History  . Not on file.   Social History Main Topics  . Smoking status: Current Every Day Smoker -- 1.00 packs/day  . Smokeless tobacco: Not on file  . Alcohol Use: No     Comment: not since 2010  . Drug Use: No  . Sexual Activity: Not on file   Other Topics Concern  . Not on file   Social History Narrative    Review of Systems  Constitutional: Negative for fever and weight loss.  HENT: Negative for ear discharge, ear pain, hearing loss and tinnitus.   Eyes: Negative for blurred vision, double vision, photophobia and pain.  Respiratory: Negative for cough and shortness of breath.   Cardiovascular: Negative for chest pain and palpitations.  Gastrointestinal: Positive for heartburn. Negative for nausea, vomiting, abdominal pain, diarrhea, constipation, blood in stool and melena.  Genitourinary: Negative for dysuria, urgency, frequency, hematuria and flank pain.  Musculoskeletal: Positive for back pain. Negative for falls.  Neurological: Negative for dizziness, loss of consciousness and headaches.  Endo/Heme/Allergies: Negative for environmental allergies.  Psychiatric/Behavioral: Negative for depression, suicidal ideas, hallucinations and substance abuse. The patient is not nervous/anxious and does not have insomnia.     There were no vitals taken for this visit.  Physical Exam  Constitutional: He is oriented to person, place, and time and well-developed, well-nourished, and in no distress.  HENT:  Head: Normocephalic and atraumatic.  Eyes: Conjunctivae are normal.  Neck: Neck supple.  Cardiovascular: Normal rate, regular rhythm, normal heart sounds and intact distal pulses.   Pulmonary/Chest: Effort normal and breath sounds normal. No respiratory distress. He has no wheezes. He has  no rales. He exhibits no tenderness.  Abdominal: Soft. Bowel sounds are normal. He exhibits no distension and no mass. There is no tenderness. There is no rebound and no guarding.  Genitourinary: Penis normal.  Musculoskeletal:       Thoracic back: He exhibits tenderness and spasm. He exhibits no bony tenderness.  Neurological: He is alert and oriented to person, place, and time.  Skin: Skin is warm and dry. No rash noted.  Psychiatric: Affect normal.  Vitals reviewed.   Recent Results (from the past 2160 hour(s))  Lipase, blood     Status: None   Collection Time: 06/27/15  5:20 PM  Result Value Ref Range   Lipase 20 11 - 51 U/L  Comprehensive metabolic panel     Status: Abnormal   Collection Time: 06/27/15  5:20 PM  Result Value Ref Range   Sodium 137 135 - 145 mmol/L   Potassium 4.1 3.5 - 5.1 mmol/L  Chloride 107 101 - 111 mmol/L   CO2 25 22 - 32 mmol/L   Glucose, Bld 101 (H) 65 - 99 mg/dL   BUN 16 6 - 20 mg/dL   Creatinine, Ser 0.95 0.61 - 1.24 mg/dL   Calcium 9.1 8.9 - 10.3 mg/dL   Total Protein 7.6 6.5 - 8.1 g/dL   Albumin 4.3 3.5 - 5.0 g/dL   AST 21 15 - 41 U/L   ALT 17 17 - 63 U/L   Alkaline Phosphatase 39 38 - 126 U/L   Total Bilirubin 0.6 0.3 - 1.2 mg/dL   GFR calc non Af Amer >60 >60 mL/min   GFR calc Af Amer >60 >60 mL/min    Comment: (NOTE) The eGFR has been calculated using the CKD EPI equation. This calculation has not been validated in all clinical situations. eGFR's persistently <60 mL/min signify possible Chronic Kidney Disease.    Anion gap 5 5 - 15  CBC     Status: Abnormal   Collection Time: 06/27/15  5:20 PM  Result Value Ref Range   WBC 6.6 3.8 - 10.6 K/uL   RBC 4.43 4.40 - 5.90 MIL/uL   Hemoglobin 13.6 13.0 - 18.0 g/dL   HCT 39.4 (L) 40.0 - 52.0 %   MCV 89.0 80.0 - 100.0 fL   MCH 30.8 26.0 - 34.0 pg   MCHC 34.6 32.0 - 36.0 g/dL   RDW 13.9 11.5 - 14.5 %   Platelets 259 150 - 440 K/uL  Urinalysis complete, with microscopic     Status:  Abnormal   Collection Time: 06/27/15  5:20 PM  Result Value Ref Range   Color, Urine YELLOW (A) YELLOW   APPearance CLEAR (A) CLEAR   Glucose, UA NEGATIVE NEGATIVE mg/dL   Bilirubin Urine NEGATIVE NEGATIVE   Ketones, ur NEGATIVE NEGATIVE mg/dL   Specific Gravity, Urine 1.020 1.005 - 1.030   Hgb urine dipstick NEGATIVE NEGATIVE   pH 6.0 5.0 - 8.0   Protein, ur NEGATIVE NEGATIVE mg/dL   Nitrite NEGATIVE NEGATIVE   Leukocytes, UA NEGATIVE NEGATIVE   RBC / HPF 0-5 0 - 5 RBC/hpf   WBC, UA NONE SEEN 0 - 5 WBC/hpf   Bacteria, UA NONE SEEN NONE SEEN   Squamous Epithelial / LPF NONE SEEN NONE SEEN   Mucous PRESENT     Assessment/Plan: 1. Pre-diabetes Patient reported. Recent ER non-fasting glucose at 101 despite not taking medications regularly. Patient does endorses he lost 50 pounds a couple of years ago. Question if pre-diabetes or frank diabetes is present. No symptoms. Will hold Metformin for now since he is not taking anyway. Will check lab panel today to include CMP, A1C and UA. Will alter regimen based on results. Dietary and exercise recommendations given.  - Comp Met (CMET) - Lipid panel - Hemoglobin A1c - Urinalysis, Routine w reflex microscopic  2. Aortic atherosclerosis (HCC) Will check lipid panel today. Continue statin as directed. Recommended daily 81 mg ASA. Will consider stress test after reviewing old Cardiology records that have been requested. Will discuss further at FU in 1 week.  - Lipid panel  3. Right flank pain MSK in nature. Negative workup. Tenderness with palpation over musculature with mild spasm. Continue OTC pain medication. Rx Baclofen. Supportive measures reviewed. FU 1 week. - baclofen (LIORESAL) 10 MG tablet; Take 1 tablet (10 mg total) by mouth 3 (three) times daily.  Dispense: 30 each; Refill: 0  4. Essential hypertension BP normotensive today despite patient not taking medications. Asymptomatic.  Will check labs today. Will get old records from  Cardiology. DASH diet reviewed. Will hold Chlorthalidone for now. FU 1 week.  5. Hyperlipidemia Will check lipid panel today as patient is fasting.   Leeanne Rio, PA-C

## 2015-07-02 NOTE — Progress Notes (Signed)
Pre visit review using our clinic review tool, if applicable. No additional management support is needed unless otherwise documented below in the visit note/SLS  

## 2015-07-03 ENCOUNTER — Telehealth: Payer: Self-pay | Admitting: *Deleted

## 2015-07-03 MED ORDER — ATORVASTATIN CALCIUM 20 MG PO TABS: 20.0000 mg | ORAL_TABLET | Freq: Every day | ORAL | Status: DC

## 2015-07-03 NOTE — Telephone Encounter (Signed)
-----   Message from Waldon MerlWilliam C Martin, PA-C sent at 07/03/2015  9:08 AM EDT ----- Labs results are in. A1C is at 5.9 so no diabetes. No need to continue Metformin.  LDL is borderline hight. Would like him to continue his cholesterol medication as directed for now. (ok to send in refills).  I am still waiting on his records from old providers. I will call him once I have reviewed everything. FU 3 months.

## 2015-07-03 NOTE — Telephone Encounter (Signed)
Called and spoke with the pt and informed him of recent lab results and note.  Pt verbalized understanding and agreed.   New prescription sent to the pharmacy by e-script.  Pt scheduled 3 month follow-up on (Wed-10/03/15 @ 9:15am).//AB/CMA

## 2015-07-09 ENCOUNTER — Encounter: Payer: Self-pay | Admitting: Physician Assistant

## 2015-07-09 ENCOUNTER — Ambulatory Visit (INDEPENDENT_AMBULATORY_CARE_PROVIDER_SITE_OTHER): Payer: Medicare Other | Admitting: Physician Assistant

## 2015-07-09 VITALS — BP 124/86 | HR 72 | Temp 98.2°F | Resp 18 | Ht 67.0 in | Wt 233.2 lb

## 2015-07-09 DIAGNOSIS — F39 Unspecified mood [affective] disorder: Secondary | ICD-10-CM

## 2015-07-09 DIAGNOSIS — I10 Essential (primary) hypertension: Secondary | ICD-10-CM

## 2015-07-09 DIAGNOSIS — B353 Tinea pedis: Secondary | ICD-10-CM

## 2015-07-09 DIAGNOSIS — M545 Low back pain, unspecified: Secondary | ICD-10-CM

## 2015-07-09 DIAGNOSIS — I700 Atherosclerosis of aorta: Secondary | ICD-10-CM | POA: Diagnosis not present

## 2015-07-09 MED ORDER — ALBUTEROL SULFATE HFA 108 (90 BASE) MCG/ACT IN AERS: 2.0000 | INHALATION_SPRAY | Freq: Four times a day (QID) | RESPIRATORY_TRACT | Status: DC | PRN

## 2015-07-09 MED ORDER — CLOTRIMAZOLE-BETAMETHASONE 1-0.05 % EX CREA: 1.0000 "application " | TOPICAL_CREAM | Freq: Two times a day (BID) | CUTANEOUS | Status: DC

## 2015-07-09 NOTE — Progress Notes (Signed)
Patient presents to clinic today for recheck of blood pressure. Chlorthalidone held at last visit due to the fact patient was not taking medication at the time and BP was normotensive. Endorses following instructions giving at visit. Patient denies chest pain, palpitations, lightheadedness, dizziness, vision changes or frequent headaches.  BP Readings from Last 3 Encounters:  07/09/15 124/86  07/02/15 118/80  06/27/15 119/86   Patient endorses R flank pain for which he was seen at last visit has completely resolved. Endorses now having some mild L flank pain starting last night. Denies change to bowel or bladder habits. Endorses pain is throbbing in nature, 2/10. Denies trauma or injury.   Patient requesting referral to Podiatry for his chronic plantar fasciitis. Endorses using orthotics to help previously but needs new ones.   Patient with history of schizophrenia noted on previous records. Needs new psychiatrist. Requesting referral to Psychiatry in [place] region. Denies mood swings, paranoia, SI/HI.   Patient c/o rash of Left medial ankle over the past few months that is pruritic but non-painful. Denies drainage or tenderness. Is wearing tennis shoes mostly.    Past Medical History  Diagnosis Date  . Hypertension   . Hyperlipemia   . Arthritis   . Sleep apnea     uses a cpap  . Back pain   . Neuromuscular disorder (HCC)     numbness lt leg  . Anxiety   . Seasonal allergies   . GERD (gastroesophageal reflux disease)   . Asthma   . Depression   . Migraines   . Chronic mental illness   . Schizo-affective type schizophrenia, subchronic state [facility])     Current Outpatient Prescriptions on File Prior to Visit  Medication Sig Dispense Refill  . albuterol (PROVENTIL HFA) 108 (90 Base) MCG/ACT inhaler Inhale 1-2 puffs into the lungs every 6 (six) hours as needed for wheezing or shortness of breath.    Marland Kitchen atorvastatin (LIPITOR) 20 MG tablet Take 1 tablet (20 mg total) by  mouth daily at 6 PM. 30 tablet 2  . baclofen (LIORESAL) 10 MG tablet Take 1 tablet (10 mg total) by mouth 3 (three) times daily. 30 each 0  . bag balm OINT ointment Apply 1 application topically as needed for dry skin.    Marland Kitchen omeprazole (PRILOSEC) 20 MG capsule Take 20 mg by mouth 2 (two) times daily. Reported on 07/02/2015    . traMADol (ULTRAM) 50 MG tablet Take 50 mg by mouth every 6 (six) hours as needed. Reported on 07/02/2015    . triamcinolone (NASACORT) 55 MCG/ACT AERO nasal inhaler Place 2 sprays into the nose daily.    . clonazePAM (KLONOPIN) 0.5 MG tablet Take 0.5 mg by mouth 2 (two) times daily as needed. Reported on 07/09/2015     No current facility-administered medications on file prior to visit.    No Known Allergies  Family History  Problem Relation Age of Onset  . Arthritis Mother 66    Deceased  . Hyperlipidemia    . Heart disease    . Hypertension    . Sudden death    . Diabetes Mother   . Healthy Father     living  . Hypertension Maternal Grandfather   . Hypertension Maternal Grandmother   . Arthritis Maternal Grandfather   . Diabetes Maternal Grandmother   . Hypertension Maternal Aunt   . Diabetes    . Lupus    . Arthritis Maternal Aunt   . Cancer Maternal Aunt  Deceased  . Heart disease Maternal Aunt     x2  . Mental illness Other     Maternal aunt  . Healthy Brother     x2  . Other Sister     Chronic?  . Mental illness Son     x2  . Healthy Daughter     x1  . Healthy Son     x2    Social History   Social History  . Marital Status: Married    Spouse Name: N/A  . Number of Children: N/A  . Years of Education: N/A   Social History Main Topics  . Smoking status: Former Smoker -- 1.00 packs/day    Quit date: 07/09/2013  . Smokeless tobacco: None  . Alcohol Use: No     Comment: not since 2010  . Drug Use: No  . Sexual Activity: Not Asked   Other Topics Concern  . None   Social History Narrative   Review of Systems - See HPI.  All  other ROS are negative.  BP 124/86 mmHg  Pulse 72  Temp(Src) 98.2 F (36.8 C) (Oral)  Resp 18  Ht 5\' 7"  (1.702 m)  Wt 233 lb 4 oz (105.802 kg)  BMI 36.52 kg/m2  SpO2 98%  Physical Exam  Constitutional: He is oriented to person, place, and time and well-developed, well-nourished, and in no distress.  HENT:  Head: Normocephalic and atraumatic.  Eyes: Conjunctivae are normal.  Neck: Neck supple.  Cardiovascular: Normal rate, regular rhythm, normal heart sounds and intact distal pulses.   Pulmonary/Chest: Effort normal and breath sounds normal. No respiratory distress. He has no wheezes. He has no rales. He exhibits no tenderness.  Abdominal: Soft. Bowel sounds are normal. He exhibits no distension and no mass. There is no tenderness. There is no rebound and no guarding.  Neurological: He is alert and oriented to person, place, and time.  Skin: Skin is warm and dry. No rash noted.  Psychiatric: Affect normal.  Vitals reviewed.   Recent Results (from the past 2160 hour(s))  Lipase, blood     Status: None   Collection Time: 06/27/15  5:20 PM  Result Value Ref Range   Lipase 20 11 - 51 U/L  Comprehensive metabolic panel     Status: Abnormal   Collection Time: 06/27/15  5:20 PM  Result Value Ref Range   Sodium 137 135 - 145 mmol/L   Potassium 4.1 3.5 - 5.1 mmol/L   Chloride 107 101 - 111 mmol/L   CO2 25 22 - 32 mmol/L   Glucose, Bld 101 (H) 65 - 99 mg/dL   BUN 16 6 - 20 mg/dL   Creatinine, Ser 08/27/15 0.61 - 1.24 mg/dL   Calcium 9.1 8.9 - 8.87 mg/dL   Total Protein 7.6 6.5 - 8.1 g/dL   Albumin 4.3 3.5 - 5.0 g/dL   AST 21 15 - 41 U/L   ALT 17 17 - 63 U/L   Alkaline Phosphatase 39 38 - 126 U/L   Total Bilirubin 0.6 0.3 - 1.2 mg/dL   GFR calc non Af Amer >60 >60 mL/min   GFR calc Af Amer >60 >60 mL/min    Comment: (NOTE) The eGFR has been calculated using the CKD EPI equation. This calculation has not been validated in all clinical situations. eGFR's persistently <60 mL/min  signify possible Chronic Kidney Disease.    Anion gap 5 5 - 15  CBC     Status: Abnormal   Collection Time: 06/27/15  5:20 PM  Result Value Ref Range   WBC 6.6 3.8 - 10.6 K/uL   RBC 4.43 4.40 - 5.90 MIL/uL   Hemoglobin 13.6 13.0 - 18.0 g/dL   HCT 39.4 (L) 40.0 - 52.0 %   MCV 89.0 80.0 - 100.0 fL   MCH 30.8 26.0 - 34.0 pg   MCHC 34.6 32.0 - 36.0 g/dL   RDW 13.9 11.5 - 14.5 %   Platelets 259 150 - 440 K/uL  Urinalysis complete, with microscopic     Status: Abnormal   Collection Time: 06/27/15  5:20 PM  Result Value Ref Range   Color, Urine YELLOW (A) YELLOW   APPearance CLEAR (A) CLEAR   Glucose, UA NEGATIVE NEGATIVE mg/dL   Bilirubin Urine NEGATIVE NEGATIVE   Ketones, ur NEGATIVE NEGATIVE mg/dL   Specific Gravity, Urine 1.020 1.005 - 1.030   Hgb urine dipstick NEGATIVE NEGATIVE   pH 6.0 5.0 - 8.0   Protein, ur NEGATIVE NEGATIVE mg/dL   Nitrite NEGATIVE NEGATIVE   Leukocytes, UA NEGATIVE NEGATIVE   RBC / HPF 0-5 0 - 5 RBC/hpf   WBC, UA NONE SEEN 0 - 5 WBC/hpf   Bacteria, UA NONE SEEN NONE SEEN   Squamous Epithelial / LPF NONE SEEN NONE SEEN   Mucous PRESENT   Comp Met (CMET)     Status: Abnormal   Collection Time: 07/02/15 10:42 AM  Result Value Ref Range   Sodium 138 135 - 145 mEq/L   Potassium 4.1 3.5 - 5.1 mEq/L   Chloride 107 96 - 112 mEq/L   CO2 25 19 - 32 mEq/L   Glucose, Bld 105 (H) 70 - 99 mg/dL   BUN 15 6 - 23 mg/dL   Creatinine, Ser 0.79 0.40 - 1.50 mg/dL   Total Bilirubin 0.6 0.2 - 1.2 mg/dL   Alkaline Phosphatase 32 (L) 39 - 117 U/L   AST 16 0 - 37 U/L   ALT 14 0 - 53 U/L   Total Protein 7.1 6.0 - 8.3 g/dL   Albumin 3.9 3.5 - 5.2 g/dL   Calcium 9.2 8.4 - 10.5 mg/dL   GFR 134.31 >60.00 mL/min  Lipid panel     Status: Abnormal   Collection Time: 07/02/15 10:42 AM  Result Value Ref Range   Cholesterol 173 0 - 200 mg/dL    Comment: ATP III Classification       Desirable:  < 200 mg/dL               Borderline High:  200 - 239 mg/dL          High:  > = 240  mg/dL   Triglycerides 112.0 0.0 - 149.0 mg/dL    Comment: Normal:  <150 mg/dLBorderline High:  150 - 199 mg/dL   HDL 37.80 (L) >39.00 mg/dL   VLDL 22.4 0.0 - 40.0 mg/dL   LDL Cholesterol 113 (H) 0 - 99 mg/dL   Total CHOL/HDL Ratio 5     Comment:                Men          Women1/2 Average Risk     3.4          3.3Average Risk          5.0          4.42X Average Risk          9.6          7.13X Average Risk  15.0          11.0                       NonHDL 135.67     Comment: NOTE:  Non-HDL goal should be 30 mg/dL higher than patient's LDL goal (i.e. LDL goal of < 70 mg/dL, would have non-HDL goal of < 100 mg/dL)  Hemoglobin A1c     Status: None   Collection Time: 07/02/15 10:42 AM  Result Value Ref Range   Hgb A1c MFr Bld 5.9 4.6 - 6.5 %    Comment: Glycemic Control Guidelines for People with Diabetes:Non Diabetic:  <6%Goal of Therapy: <7%Additional Action Suggested:  >8%   Urinalysis, Routine w reflex microscopic     Status: None   Collection Time: 07/02/15 10:42 AM  Result Value Ref Range   Color, Urine YELLOW Yellow;Lt. Yellow   APPearance CLEAR Clear   Specific Gravity, Urine 1.020 1.000-1.030   pH 6.5 5.0 - 8.0   Total Protein, Urine NEGATIVE Negative   Urine Glucose NEGATIVE Negative   Ketones, ur NEGATIVE Negative   Bilirubin Urine NEGATIVE Negative   Hgb urine dipstick NEGATIVE Negative   Urobilinogen, UA 0.2 0.0 - 1.0   Leukocytes, UA NEGATIVE Negative   Nitrite NEGATIVE Negative   WBC, UA none seen 0-2/hpf   RBC / HPF none seen 0-2/hpf   Squamous Epithelial / LPF Rare(0-4/hpf) Rare(0-4/hpf)    Assessment/Plan: 1. Essential hypertension BP normotensive without medication. Previous weight loss is most likely reason for this. No need to continue Chlorthalidone at present. DASH diet reviewed. Will monitor at subsequent visit.  2. Aortic atherosclerosis ([place]) Noted on CT renal study from ER visit. Lipids stable. Will continue Lipitor 20 mg daily. Still waiting on  Cardiology records to determine if stress test needed.  3. Left-sided low back pain without sciatica Baclofen as directed. Rest. No heavy lifting. Heating pad as directed. FU if not resolving.  4. Tinea pedis of left foot Rx Lotirone cream. Supportive measures reviewed. Referral placed to Podiatry at patient request.  5. Mood Disorder Referral to Psyciaitry placed per patient request. Stable at present. Continue Klonopin.   Leeanne Rio, PA-C

## 2015-07-09 NOTE — Progress Notes (Signed)
Pre visit review using our clinic review tool, if applicable. No additional management support is needed unless otherwise documented below in the visit note/SLS  

## 2015-07-09 NOTE — Patient Instructions (Signed)
Please start the Baclofen, taking as directed. Tylenol for pain. Apply heating pad to the area. No heavy lifting or overexertion. Follow-up if symptoms are not resolving.  You will be contacted by Psychiatry and Podiatry as requested. Use the lotrisone cream as directed to the area of concern on the foot. Keep feet clean and dry. Switch out shoes between days.  We are staying off of the metformin and chlorthalidone as BP and sugar have been normal despite you not taking these medications.  Follow-up in 3 months.  DASH Eating Plan DASH stands for "Dietary Approaches to Stop Hypertension." The DASH eating plan is a healthy eating plan that has been shown to reduce high blood pressure (hypertension). Additional health benefits may include reducing the risk of type 2 diabetes mellitus, heart disease, and stroke. The DASH eating plan may also help with weight loss. WHAT DO I NEED TO KNOW ABOUT THE DASH EATING PLAN? For the DASH eating plan, you will follow these general guidelines:  Choose foods with a percent daily value for sodium of less than 5% (as listed on the food label).  Use salt-free seasonings or herbs instead of table salt or sea salt.  Check with your health care provider or pharmacist before using salt substitutes.  Eat lower-sodium products, often labeled as "lower sodium" or "no salt added."  Eat fresh foods.  Eat more vegetables, fruits, and low-fat dairy products.  Choose whole grains. Look for the word "whole" as the first word in the ingredient list.  Choose fish and skinless chicken or [place] more often than red meat. Limit fish, poultry, and meat to 6 oz (170 g) each day.  Limit sweets, desserts, sugars, and sugary drinks.  Choose heart-healthy fats.  Limit cheese to 1 oz (28 g) per day.  Eat more home-cooked food and less restaurant, buffet, and fast food.  Limit fried foods.  Cook foods using methods other than frying.  Limit canned vegetables. If you  do use them, rinse them well to decrease the sodium.  When eating at a restaurant, ask that your food be prepared with less salt, or no salt if possible. WHAT FOODS CAN I EAT? Seek help from a dietitian for individual calorie needs. Grains Whole grain or whole wheat bread. Brown rice. Whole grain or whole wheat pasta. Quinoa, bulgur, and whole grain cereals. Low-sodium cereals. Corn or whole wheat flour tortillas. Whole grain cornbread. Whole grain crackers. Low-sodium crackers. Vegetables Fresh or frozen vegetables (raw, steamed, roasted, or grilled). Low-sodium or reduced-sodium tomato and vegetable juices. Low-sodium or reduced-sodium tomato sauce and paste. Low-sodium or reduced-sodium canned vegetables.  Fruits All fresh, canned (in natural juice), or frozen fruits. Meat and Other Protein Products Ground beef (85% or leaner), grass-fed beef, or beef trimmed of fat. Skinless chicken or [place]. Ground chicken or [place]. Pork trimmed of fat. All fish and seafood. Eggs. Dried beans, peas, or lentils. Unsalted nuts and seeds. Unsalted canned beans. Dairy Low-fat dairy products, such as skim or 1% milk, 2% or reduced-fat cheeses, low-fat ricotta or cottage cheese, or plain low-fat yogurt. Low-sodium or reduced-sodium cheeses. Fats and Oils Tub margarines without trans fats. Light or reduced-fat mayonnaise and salad dressings (reduced sodium). Avocado. Safflower, olive, or canola oils. Natural peanut or almond butter. Other Unsalted popcorn and pretzels. The items listed above may not be a complete list of recommended foods or beverages. Contact your dietitian for more options. WHAT FOODS ARE NOT RECOMMENDED? Grains White bread. White pasta. White rice. Refined cornbread. Bagels  and croissants. Crackers that contain trans fat. Vegetables Creamed or fried vegetables. Vegetables in a cheese sauce. Regular canned vegetables. Regular canned tomato sauce and paste. Regular tomato and vegetable  juices. Fruits Dried fruits. Canned fruit in light or heavy syrup. Fruit juice. Meat and Other Protein Products Fatty cuts of meat. Ribs, chicken wings, bacon, sausage, bologna, salami, chitterlings, fatback, hot dogs, bratwurst, and packaged luncheon meats. Salted nuts and seeds. Canned beans with salt. Dairy Whole or 2% milk, cream, half-and-half, and cream cheese. Whole-fat or sweetened yogurt. Full-fat cheeses or blue cheese. Nondairy creamers and whipped toppings. Processed cheese, cheese spreads, or cheese curds. Condiments Onion and garlic salt, seasoned salt, table salt, and sea salt. Canned and packaged gravies. Worcestershire sauce. Tartar sauce. Barbecue sauce. Teriyaki sauce. Soy sauce, including reduced sodium. Steak sauce. Fish sauce. Oyster sauce. Cocktail sauce. Horseradish. Ketchup and mustard. Meat flavorings and tenderizers. Bouillon cubes. Hot sauce. Tabasco sauce. Marinades. Taco seasonings. Relishes. Fats and Oils Butter, stick margarine, lard, shortening, ghee, and bacon fat. Coconut, palm kernel, or palm oils. Regular salad dressings. Other Pickles and olives. Salted popcorn and pretzels. The items listed above may not be a complete list of foods and beverages to avoid. Contact your dietitian for more information. WHERE CAN I FIND MORE INFORMATION? National Heart, Lung, and Blood Institute: CablePromo.itwww.nhlbi.nih.gov/health/health-topics/topics/dash/   This information is not intended to replace advice given to you by your health care provider. Make sure you discuss any questions you have with your health care provider.   Document Released: 12/26/2010 Document Revised: 01/27/2014 Document Reviewed: 11/10/2012 Elsevier Interactive Patient Education [employer]2016 Elsevier Inc.

## 2015-07-26 ENCOUNTER — Encounter: Payer: Self-pay | Admitting: Physician Assistant

## 2015-07-26 DIAGNOSIS — E785 Hyperlipidemia, unspecified: Secondary | ICD-10-CM | POA: Insufficient documentation

## 2015-07-26 DIAGNOSIS — G4733 Obstructive sleep apnea (adult) (pediatric): Secondary | ICD-10-CM | POA: Insufficient documentation

## 2015-07-26 DIAGNOSIS — F411 Generalized anxiety disorder: Secondary | ICD-10-CM | POA: Insufficient documentation

## 2015-07-30 ENCOUNTER — Telehealth: Payer: Self-pay | Admitting: Physician Assistant

## 2015-07-30 DIAGNOSIS — R109 Unspecified abdominal pain: Secondary | ICD-10-CM

## 2015-07-30 NOTE — Telephone Encounter (Signed)
°  Relationship to patient: Self Can be reached: [phone]  Pharmacy:  [id number] - [place], [place] - [address] [facility] [address] [phone] ([phone] (Fax)         Reason for call: Patient called stating that he now has insurance and he needs his medications called in to the pharmacy.

## 2015-07-30 NOTE — Telephone Encounter (Signed)
Patient calling checking on the status of medication refill request patient does't know the names of medications and states a lot was discussed at previous appointment but states its for arthritis, pain inflammation and cream Rx. Please advise

## 2015-07-31 MED ORDER — CLONAZEPAM 0.5 MG PO TABS: 0.5000 mg | ORAL_TABLET | Freq: Two times a day (BID) | ORAL | Status: DC | PRN

## 2015-07-31 MED ORDER — BACLOFEN 10 MG PO TABS: 10.0000 mg | ORAL_TABLET | Freq: Three times a day (TID) | ORAL | Status: DC

## 2015-07-31 MED ORDER — OMEPRAZOLE 20 MG PO CPDR
20.0000 mg | DELAYED_RELEASE_CAPSULE | Freq: Two times a day (BID) | ORAL | Status: AC
Start: 2015-07-31 — End: ?

## 2015-07-31 MED ORDER — TRIAMCINOLONE ACETONIDE 55 MCG/ACT NA AERO: 2.0000 | INHALATION_SPRAY | Freq: Every day | NASAL | Status: DC

## 2015-07-31 MED ORDER — CLOTRIMAZOLE-BETAMETHASONE 1-0.05 % EX CREA: 1.0000 "application " | TOPICAL_CREAM | Freq: Two times a day (BID) | CUTANEOUS | Status: DC

## 2015-07-31 MED ORDER — ATORVASTATIN CALCIUM 20 MG PO TABS: 20.0000 mg | ORAL_TABLET | Freq: Every day | ORAL | Status: DC

## 2015-07-31 MED ORDER — ALBUTEROL SULFATE HFA 108 (90 BASE) MCG/ACT IN AERS
2.0000 | INHALATION_SPRAY | Freq: Four times a day (QID) | RESPIRATORY_TRACT | Status: DC | PRN
Start: 2015-07-31 — End: 2015-08-10

## 2015-07-31 MED ORDER — TRAMADOL HCL 50 MG PO TABS: 50.0000 mg | ORAL_TABLET | Freq: Four times a day (QID) | ORAL | Status: DC | PRN

## 2015-07-31 NOTE — Addendum Note (Signed)
Addended by: Regis BillSCATES, SHARON L on: 07/31/2015 05:43 PM   Modules accepted: Orders

## 2015-07-31 NOTE — Addendum Note (Signed)
Addended by: Regis BillSCATES, Other Atienza L on: 07/31/2015 01:08 PM   Modules accepted: Orders, Medications

## 2015-07-31 NOTE — Telephone Encounter (Signed)
Rx to pharmacy via escribe and fax/SLSL 07/11

## 2015-08-10 ENCOUNTER — Telehealth: Payer: Self-pay | Admitting: *Deleted

## 2015-08-10 MED ORDER — ALBUTEROL SULFATE HFA 108 (90 BASE) MCG/ACT IN AERS: 1.0000 | INHALATION_SPRAY | Freq: Four times a day (QID) | RESPIRATORY_TRACT | Status: DC | PRN

## 2015-08-10 NOTE — Telephone Encounter (Signed)
[facility] stating they will no longer cover Proventil/Ventolin, only ProAir; new Rx for ProAir sent to patient's pharmacy; [facility]SLS 07/21

## 2015-08-22 ENCOUNTER — Telehealth: Payer: Self-pay | Admitting: Physician Assistant

## 2015-08-22 ENCOUNTER — Ambulatory Visit: Payer: Medicare Other | Admitting: Physician Assistant

## 2015-08-23 NOTE — Telephone Encounter (Signed)
No charge for 1st no-show 

## 2015-08-23 NOTE — Telephone Encounter (Signed)
Pt was no show 08/22/15 for 30 min ov, pt has not rescheduled, 1st no show, charge or no charge?

## 2015-09-05 ENCOUNTER — Encounter: Payer: Self-pay | Admitting: Physician Assistant

## 2015-09-05 ENCOUNTER — Ambulatory Visit (INDEPENDENT_AMBULATORY_CARE_PROVIDER_SITE_OTHER): Payer: Medicare Other | Admitting: Physician Assistant

## 2015-09-05 VITALS — BP 128/88 | HR 70 | Temp 98.7°F | Resp 16 | Ht 67.0 in | Wt 232.0 lb

## 2015-09-05 DIAGNOSIS — M1712 Unilateral primary osteoarthritis, left knee: Secondary | ICD-10-CM | POA: Diagnosis not present

## 2015-09-05 DIAGNOSIS — M722 Plantar fascial fibromatosis: Secondary | ICD-10-CM

## 2015-09-05 MED ORDER — MELOXICAM 15 MG PO TABS: 15.0000 mg | ORAL_TABLET | Freq: Every day | ORAL | 0 refills | Status: DC

## 2015-09-05 NOTE — Progress Notes (Signed)
Patient presents to clinic today c/o pain in L foot and ankle that is chronic. Denies redness or warmth to the joints. Notes some mild swelling of forefoot. Pain is aching and worsened with prolonged ambulation. Pain is mainly of the heel. Denies trauma or injury. Denies numbness or tingling.  We also received forms for an orthotic brace for L knee. Patient with history of chronic OA of right knee causing daily discomfort. Previously followed by Orthopedics. Takes OTC aleve or tylenol for symptom relief.  Past Medical History:  Diagnosis Date  . Anxiety   . Arthritis   . Asthma   . Back pain   . Chronic mental illness   . Depression   . GERD (gastroesophageal reflux disease)   . Hyperlipemia   . Hypertension   . Migraines   . Neuromuscular disorder (HCC)    numbness lt leg  . Schizo-affective type schizophrenia, subchronic state ([place])   . Seasonal allergies   . Sleep apnea    uses a cpap    Current Outpatient Prescriptions on File Prior to Visit  Medication Sig Dispense Refill  . albuterol (PROAIR HFA) 108 (90 Base) MCG/ACT inhaler Inhale 1-2 puffs into the lungs every 6 (six) hours as needed for wheezing or shortness of breath. 1 Inhaler 5  . atorvastatin (LIPITOR) 20 MG tablet Take 1 tablet (20 mg total) by mouth daily at 6 PM. 30 tablet 2  . baclofen (LIORESAL) 10 MG tablet Take 1 tablet (10 mg total) by mouth 3 (three) times daily. 90 each 3  . bag balm OINT ointment Apply 1 application topically as needed for dry skin.    . clonazePAM (KLONOPIN) 0.5 MG tablet Take 1 tablet (0.5 mg total) by mouth 2 (two) times daily as needed. Reported on 07/09/2015 30 tablet 0  . clotrimazole-betamethasone (LOTRISONE) cream Apply 1 application topically 2 (two) times daily. 30 g 0  . omeprazole (PRILOSEC) 20 MG capsule Take 1 capsule (20 mg total) by mouth 2 (two) times daily. Reported on 07/02/2015 60 capsule 2  . traMADol (ULTRAM) 50 MG tablet Take 1 tablet (50 mg total) by mouth every 6  (six) hours as needed. Reported on 07/02/2015 30 tablet 0  . triamcinolone (NASACORT) 55 MCG/ACT AERO nasal inhaler Place 2 sprays into the nose daily. 1 Inhaler 2   No current facility-administered medications on file prior to visit.     No Known Allergies  Family History  Problem Relation Age of Onset  . Arthritis Mother 51    Deceased  . Hyperlipidemia    . Heart disease    . Hypertension    . Sudden death    . Diabetes Mother   . Healthy Father     living  . Hypertension Maternal Grandfather   . Hypertension Maternal Grandmother   . Arthritis Maternal Grandfather   . Diabetes Maternal Grandmother   . Hypertension Maternal Aunt   . Diabetes    . Lupus    . Arthritis Maternal Aunt   . Cancer Maternal Aunt     Deceased  . Heart disease Maternal Aunt     x2  . Mental illness Other     Maternal aunt  . Healthy Brother     x2  . Other Sister     Chronic?  . Mental illness Son     x2  . Healthy Daughter     x1  . Healthy Son     x2    Social History  Social History  . Marital status: Married    Spouse name: N/A  . Number of children: N/A  . Years of education: N/A   Social History Main Topics  . Smoking status: Former Smoker    Packs/day: 1.00    Quit date: 07/09/2013  . Smokeless tobacco: None  . Alcohol use No     Comment: not since 2010  . Drug use: No  . Sexual activity: Not Asked   Other Topics Concern  . None   Social History Narrative  . None    Review of Systems - See HPI.  All other ROS are negative.  BP 128/88 (BP Location: Left Arm, Patient Position: Sitting, Cuff Size: Large)   Pulse 70   Temp 98.7 F (37.1 C) (Oral)   Resp 16   Ht _0  (1.702 m)   Wt 232 lb (105.2 kg)   SpO2 98%   BMI 36.34 kg/m   Physical Exam  Constitutional: He is oriented to person, place, and time and well-developed, well-nourished, and in no distress.  HENT:  Head: Normocephalic and atraumatic.  Eyes: Conjunctivae are normal.  Cardiovascular:  Normal rate, regular rhythm, normal heart sounds and intact distal pulses.   Pulmonary/Chest: Effort normal and breath sounds normal. No respiratory distress. He has no wheezes. He has no rales. He exhibits no tenderness.  Musculoskeletal:       Left knee: He exhibits normal range of motion, no swelling and normal patellar mobility. No tenderness found.       Left ankle: He exhibits normal range of motion, no swelling and normal pulse. Tenderness. Achilles tendon exhibits no pain.       Feet:  Neurological: He is alert and oriented to person, place, and time.  Skin: Skin is warm and dry. No rash noted.  Psychiatric: Affect normal.    Recent Results (from the past 2160 hour(s))  Lipase, blood     Status: None   Collection Time: 06/27/15  5:20 PM  Result Value Ref Range   Lipase 20 11 - 51 U/L  Comprehensive metabolic panel     Status: Abnormal   Collection Time: 06/27/15  5:20 PM  Result Value Ref Range   Sodium 137 135 - 145 mmol/L   Potassium 4.1 3.5 - 5.1 mmol/L   Chloride 107 101 - 111 mmol/L   CO2 25 22 - 32 mmol/L   Glucose, Bld 101 (H) 65 - 99 mg/dL   BUN 16 6 - 20 mg/dL   Creatinine, Ser 0.95 0.61 - 1.24 mg/dL   Calcium 9.1 8.9 - 10.3 mg/dL   Total Protein 7.6 6.5 - 8.1 g/dL   Albumin 4.3 3.5 - 5.0 g/dL   AST 21 15 - 41 U/L   ALT 17 17 - 63 U/L   Alkaline Phosphatase 39 38 - 126 U/L   Total Bilirubin 0.6 0.3 - 1.2 mg/dL   GFR calc non Af Amer >60 >60 mL/min   GFR calc Af Amer >60 >60 mL/min    Comment: (NOTE) The eGFR has been calculated using the CKD EPI equation. This calculation has not been validated in all clinical situations. eGFR's persistently <60 mL/min signify possible Chronic Kidney Disease.    Anion gap 5 5 - 15  CBC     Status: Abnormal   Collection Time: 06/27/15  5:20 PM  Result Value Ref Range   WBC 6.6 3.8 - 10.6 K/uL   RBC 4.43 4.40 - 5.90 MIL/uL   Hemoglobin 13.6 13.0 -  18.0 g/dL   HCT 39.4 (L) 40.0 - 52.0 %   MCV 89.0 80.0 - 100.0 fL   MCH  30.8 26.0 - 34.0 pg   MCHC 34.6 32.0 - 36.0 g/dL   RDW 13.9 11.5 - 14.5 %   Platelets 259 150 - 440 K/uL  Urinalysis complete, with microscopic     Status: Abnormal   Collection Time: 06/27/15  5:20 PM  Result Value Ref Range   Color, Urine YELLOW (A) YELLOW   APPearance CLEAR (A) CLEAR   Glucose, UA NEGATIVE NEGATIVE mg/dL   Bilirubin Urine NEGATIVE NEGATIVE   Ketones, ur NEGATIVE NEGATIVE mg/dL   Specific Gravity, Urine 1.020 1.005 - 1.030   Hgb urine dipstick NEGATIVE NEGATIVE   pH 6.0 5.0 - 8.0   Protein, ur NEGATIVE NEGATIVE mg/dL   Nitrite NEGATIVE NEGATIVE   Leukocytes, UA NEGATIVE NEGATIVE   RBC / HPF 0-5 0 - 5 RBC/hpf   WBC, UA NONE SEEN 0 - 5 WBC/hpf   Bacteria, UA NONE SEEN NONE SEEN   Squamous Epithelial / LPF NONE SEEN NONE SEEN   Mucous PRESENT   Comp Met (CMET)     Status: Abnormal   Collection Time: 07/02/15 10:42 AM  Result Value Ref Range   Sodium 138 135 - 145 mEq/L   Potassium 4.1 3.5 - 5.1 mEq/L   Chloride 107 96 - 112 mEq/L   CO2 25 19 - 32 mEq/L   Glucose, Bld 105 (H) 70 - 99 mg/dL   BUN 15 6 - 23 mg/dL   Creatinine, Ser 0.79 0.40 - 1.50 mg/dL   Total Bilirubin 0.6 0.2 - 1.2 mg/dL   Alkaline Phosphatase 32 (L) 39 - 117 U/L   AST 16 0 - 37 U/L   ALT 14 0 - 53 U/L   Total Protein 7.1 6.0 - 8.3 g/dL   Albumin 3.9 3.5 - 5.2 g/dL   Calcium 9.2 8.4 - 10.5 mg/dL   GFR 134.31 >60.00 mL/min  Lipid panel     Status: Abnormal   Collection Time: 07/02/15 10:42 AM  Result Value Ref Range   Cholesterol 173 0 - 200 mg/dL    Comment: ATP III Classification       Desirable:  < 200 mg/dL               Borderline High:  200 - 239 mg/dL          High:  > = 240 mg/dL   Triglycerides 112.0 0.0 - 149.0 mg/dL    Comment: Normal:  <150 mg/dLBorderline High:  150 - 199 mg/dL   HDL 37.80 (L) >39.00 mg/dL   VLDL 22.4 0.0 - 40.0 mg/dL   LDL Cholesterol 113 (H) 0 - 99 mg/dL   Total CHOL/HDL Ratio 5     Comment:                Men          Women1/2 Average Risk     3.4           3.3Average Risk          5.0          4.42X Average Risk          9.6          7.13X Average Risk          15.0          11.0  NonHDL 135.67     Comment: NOTE:  Non-HDL goal should be 30 mg/dL higher than patient's LDL goal (i.e. LDL goal of < 70 mg/dL, would have non-HDL goal of < 100 mg/dL)  Hemoglobin A1c     Status: None   Collection Time: 07/02/15 10:42 AM  Result Value Ref Range   Hgb A1c MFr Bld 5.9 4.6 - 6.5 %    Comment: Glycemic Control Guidelines for People with Diabetes:Non Diabetic:  <6%Goal of Therapy: <7%Additional Action Suggested:  >8%   Urinalysis, Routine w reflex microscopic     Status: None   Collection Time: 07/02/15 10:42 AM  Result Value Ref Range   Color, Urine YELLOW Yellow;Lt. Yellow   APPearance CLEAR Clear   Specific Gravity, Urine 1.020 1.000 - 1.030   pH 6.5 5.0 - 8.0   Total Protein, Urine NEGATIVE Negative   Urine Glucose NEGATIVE Negative   Ketones, ur NEGATIVE Negative   Bilirubin Urine NEGATIVE Negative   Hgb urine dipstick NEGATIVE Negative   Urobilinogen, UA 0.2 0.0 - 1.0   Leukocytes, UA NEGATIVE Negative   Nitrite NEGATIVE Negative   WBC, UA none seen 0-2/hpf   RBC / HPF none seen 0-2/hpf   Squamous Epithelial / LPF Rare(0-4/hpf) Rare(0-4/hpf)    Assessment/Plan: 1. Plantar fasciitis of left foot Discussed supportive footwear and compression. Rx mobic to help with inflammation. Referral placed to podiatry for ongoing symptoms.  - Ambulatory referral to Podiatry  2. Primary osteoarthritis of left knee Do feel patient would benefit from orthotic brace. Forms filled out and faxed in. Supportive measures and pain medication discussed.    Leeanne Rio, PA-C

## 2015-09-05 NOTE — Patient Instructions (Signed)
Please take the mobic daily as directed with food. Wear supportive shoes. You will be contacted for assessment by Podiatry as I have replaced the referral.

## 2015-09-12 ENCOUNTER — Telehealth: Payer: Self-pay | Admitting: *Deleted

## 2015-09-12 MED ORDER — ALBUTEROL SULFATE HFA 108 (90 BASE) MCG/ACT IN AERS: 1.0000 | INHALATION_SPRAY | Freq: Four times a day (QID) | RESPIRATORY_TRACT | 5 refills | Status: AC | PRN

## 2015-09-12 NOTE — Telephone Encounter (Signed)
Received letter from [employer] reporting hey have given patient short supply of ProAir HFA, but will no longer be supplied by insurance in the future. Insurance suggest Ventolin HFA as a replacement; patient informed, understood & agreed; new Rx to pharmacy/SLS 08/23

## 2015-09-18 ENCOUNTER — Telehealth: Payer: Self-pay | Admitting: *Deleted

## 2015-09-18 ENCOUNTER — Encounter: Payer: Self-pay | Admitting: Podiatry

## 2015-09-18 ENCOUNTER — Ambulatory Visit (INDEPENDENT_AMBULATORY_CARE_PROVIDER_SITE_OTHER): Payer: Medicare Other | Admitting: Podiatry

## 2015-09-18 ENCOUNTER — Ambulatory Visit (INDEPENDENT_AMBULATORY_CARE_PROVIDER_SITE_OTHER): Payer: Medicare Other

## 2015-09-18 DIAGNOSIS — M779 Enthesopathy, unspecified: Secondary | ICD-10-CM | POA: Diagnosis not present

## 2015-09-18 DIAGNOSIS — M19072 Primary osteoarthritis, left ankle and foot: Secondary | ICD-10-CM

## 2015-09-18 DIAGNOSIS — R52 Pain, unspecified: Secondary | ICD-10-CM

## 2015-09-18 DIAGNOSIS — M722 Plantar fascial fibromatosis: Secondary | ICD-10-CM

## 2015-09-18 DIAGNOSIS — M19079 Primary osteoarthritis, unspecified ankle and foot: Secondary | ICD-10-CM

## 2015-09-18 MED ORDER — KETOCONAZOLE 2 % EX CREA: 1.0000 "application " | TOPICAL_CREAM | Freq: Every day | CUTANEOUS | 2 refills | Status: DC

## 2015-09-18 NOTE — Progress Notes (Signed)
Subjective:     Patient ID: Danny Mcdowell, male   DOB: 15-Jul-1965, 50 y.o.   MRN: [id number]  HPI 50 year old male presents the office if her chronic foot and ankle pain which has been ongoing for several years. He states that he has seen 2 other physicians for this previously has a multiple injections as well as taping, physical therapy and custom orthotics without any relief. He states he has pain in the morning when he first gets up and throughout the day. He also states he is getting pain to his left foot aching with sitting. He gets an occasional numbness of the pharmacy. He also states that he has athlete's foot and does itch his had multiple creams for this. He has not had any quite some time. No other complaints.  Review of Systems  All other systems reviewed and are negative.      Objective:   Physical Exam General: AAO x3, NAD  Dermatological:Dry, scaly, erythematous skin on plantar aspect of the foot with evidence of tinea pedis. Is no drainage. No open lesions or pre-ulcer lesions. Nails are dystrophic, discolored.  Vascular: Dorsalis Pedis artery and Posterior Tibial artery pedal pulses are 2/4 bilateral with immedate capillary fill time. Pedal hair growth present. No varicosities and no lower extremity edema present bilateral. There is no pain with calf compression, swelling, warmth, erythema.   Neruologic: Grossly intact via light touch bilateral. Vibratory intact via tuning fork bilateral. Protective threshold with Semmes Wienstein monofilament intact to all pedal sites bilateral.   Musculoskeletal: There is tenderness of the plantar medial tubercle of the calcaneus at insertion the plantar fascia bilaterally and there is localized edema to this area. There is no erythema or increase in warmth. There is mild discomfort on the medial band plantar fasciitis with the extra foot with the left side worse in the right. On the left side there is tenderness on both the medial and  lateral aspect of the ankle on the course of the flexor as well as the peroneal tendons. Ankle, subtalar joint range of motion intact. Mild discomfort to the anterior aspect of the ankle on the left side. Decrease in medial arch height. There is no edema, erythema, increase in warmth otherwise. MMT 5/5.  Gait: Unassisted, Nonantalgic.      Assessment:     50 year old male bilateral chronic foot and ankle pain with left side worse than the right; tinea pedis    Plan:     -Treatment options discussed including all alternatives, risks, and complications -Etiology of symptoms were discussed -X-rays were obtained and reviewed with the patient. No evidence of acute fracture identified this time. Arthritic changes are present. Posterior heel spur. -At this time given the longevity of symptoms as well as multiple conservative treatments I recommended MRI of the left foot and ankle to evaluate for plantar fascial tear, tendon te -Follow-up after MRI or sooner if any problems arise. In the meantime, encouraged to call the office with any questions, concerns, change in symptoms.   Ovid CurdMatthew Emberleigh Reily, DPM

## 2015-09-18 NOTE — Telephone Encounter (Addendum)
-----   Message from Vivi BarrackMatthew R Wagoner, DPM sent at 09/18/2015  9:38 AM EDT ----- Can you order an MRI of the left foot and ankle to rule out plantar fascia tear and ankle osteochondral lesion/arthritis? I will do the note at lunch today. Orders to D. Meadows for [employer]. 10/12/2015-Left message requesting pt call with more information concerning MRI.  I spoke with D. Meadows and she states pt has Medicare and does not need prior authorization.  I called pt and gave him the information and [facility] app[facility] to schedule his MRI appt.

## 2015-10-01 ENCOUNTER — Telehealth: Payer: Self-pay | Admitting: Physician Assistant

## 2015-10-01 NOTE — Telephone Encounter (Signed)
°  Relation to AO:ZHYQpt:self Call back number:[phone] Pharmacy:  Reason for call:  Pt would like to know if Selena BattenCody has gotten his results back from the foot doctor states his visit was about 2 weeks ago .

## 2015-10-01 NOTE — Telephone Encounter (Signed)
Patient will need to contact Dr. Gabriel RungWagoner's office [Podiatry] for results as they were the ordering office/SLS 09/11 Thanks.

## 2015-10-02 ENCOUNTER — Telehealth: Payer: Self-pay | Admitting: Physician Assistant

## 2015-10-02 NOTE — Telephone Encounter (Signed)
Self. Refill for clonazePAM, Tramadol     Pharmacy: [facility] [id number] - [place], [place] - [address]

## 2015-10-03 ENCOUNTER — Ambulatory Visit: Payer: Medicare Other | Admitting: Physician Assistant

## 2015-10-03 MED ORDER — CLONAZEPAM 0.5 MG PO TABS: 0.5000 mg | ORAL_TABLET | Freq: Two times a day (BID) | ORAL | 0 refills | Status: DC | PRN

## 2015-10-03 MED ORDER — TRAMADOL HCL 50 MG PO TABS: 50.0000 mg | ORAL_TABLET | Freq: Four times a day (QID) | ORAL | 0 refills | Status: DC | PRN

## 2015-10-03 NOTE — Telephone Encounter (Signed)
Refill request for Clonazepam 0.5mg  Refill request for Tramadol 50 mg Last filled by MD on - 07/31/15, #30x0 Last AEX - 09/05/15 Please Advise on refills/SLS 09/13

## 2015-10-03 NOTE — Telephone Encounter (Signed)
FYI: Rx refills phoned to pharmacy; pt informed, understood, but states that "provider must have him confused with another patient, as he is not moving out of area, and has no plans to change providers"; informed him we are sorry for the miscommunication and there is no problem with him receiving his medicationss as long as he continues to keep future appointments, understood & agreed/SLS 09/13

## 2015-10-03 NOTE — Telephone Encounter (Signed)
Ok to give one month of each. Last refill. He was scheduling with a new provider ([place] If he has not established with new provider, he will need follow-up before further refills.

## 2015-10-03 NOTE — Telephone Encounter (Signed)
lvm informing pt to contact Dr. Gabriel RungWagoner's office directly

## 2015-10-03 NOTE — Addendum Note (Signed)
Addended by: Regis BillSCATES, SHARON L on: 10/03/2015 05:40 PM   Modules accepted: Orders

## 2015-10-24 ENCOUNTER — Ambulatory Visit
Admission: RE | Admit: 2015-10-24 | Discharge: 2015-10-24 | Disposition: A | Discharge status: Home / Self Care | Payer: Medicare Other | Source: Ambulatory Visit | Attending: Podiatry | Admitting: Podiatry

## 2015-10-24 DIAGNOSIS — M19072 Primary osteoarthritis, left ankle and foot: Secondary | ICD-10-CM | POA: Insufficient documentation

## 2015-10-24 DIAGNOSIS — R52 Pain, unspecified: Secondary | ICD-10-CM | POA: Diagnosis not present

## 2015-10-24 DIAGNOSIS — M722 Plantar fascial fibromatosis: Secondary | ICD-10-CM | POA: Insufficient documentation

## 2015-10-24 DIAGNOSIS — M779 Enthesopathy, unspecified: Secondary | ICD-10-CM | POA: Diagnosis not present

## 2015-10-24 DIAGNOSIS — M25572 Pain in left ankle and joints of left foot: Secondary | ICD-10-CM | POA: Diagnosis not present

## 2015-10-29 ENCOUNTER — Ambulatory Visit (INDEPENDENT_AMBULATORY_CARE_PROVIDER_SITE_OTHER): Payer: Medicare Other | Admitting: Physician Assistant

## 2015-10-29 ENCOUNTER — Telehealth: Payer: Self-pay | Admitting: Physician Assistant

## 2015-10-29 ENCOUNTER — Encounter: Payer: Self-pay | Admitting: Physician Assistant

## 2015-10-29 ENCOUNTER — Other Ambulatory Visit: Payer: Self-pay | Admitting: Physician Assistant

## 2015-10-29 VITALS — BP 108/76 | HR 98 | Temp 98.2°F | Resp 16 | Ht 67.0 in | Wt 231.0 lb

## 2015-10-29 DIAGNOSIS — I10 Essential (primary) hypertension: Secondary | ICD-10-CM | POA: Diagnosis not present

## 2015-10-29 MED ORDER — HYDROCHLOROTHIAZIDE 12.5 MG PO CAPS: 12.5000 mg | ORAL_CAPSULE | Freq: Every day | ORAL | 0 refills | Status: DC

## 2015-10-29 NOTE — Patient Instructions (Addendum)
Please take the Hydrochlorothiazide 12.5 mg daily as directed. Please eat a well-balanced. Do not add salt. Stay well-hydrated. Check BP daily before taking medication. If < 130/800, do not take. Check BP 2 hours after taking medication and write down.  Follow-up with me in 2 weeks. You will be contacted for assessment by Pain Management.   DASH Eating Plan DASH stands for "Dietary Approaches to Stop Hypertension." The DASH eating plan is a healthy eating plan that has been shown to reduce high blood pressure (hypertension). Additional health benefits may include reducing the risk of type 2 diabetes mellitus, heart disease, and stroke. The DASH eating plan may also help with weight loss. WHAT DO I NEED TO KNOW ABOUT THE DASH EATING PLAN? For the DASH eating plan, you will follow these general guidelines:  Choose foods with a percent daily value for sodium of less than 5% (as listed on the food label).  Use salt-free seasonings or herbs instead of table salt or sea salt.  Check with your health care provider or pharmacist before using salt substitutes.  Eat lower-sodium products, often labeled as "lower sodium" or "no salt added."  Eat fresh foods.  Eat more vegetables, fruits, and low-fat dairy products.  Choose whole grains. Look for the word "whole" as the first word in the ingredient list.  Choose fish and skinless chicken or Mal[place]urkey more often than red meat. Limit fish, poultry, and meat to 6 oz (170 g) each day.  Limit sweets, desserts, sugars, and sugary drinks.  Choose heart-healthy fats.  Limit cheese to 1 oz (28 g) per day.  Eat more home-cooked food and less restaurant, buffet, and fast food.  Limit fried foods.  Cook foods using methods other than frying.  Limit canned vegetables. If you do use them, rinse them well to decrease the sodium.  When eating at a restaurant, ask that your food be prepared with less salt, or no salt if possible. WHAT FOODS CAN I  EAT? Seek help from a dietitian for individual calorie needs. Grains Whole grain or whole wheat bread. Brown rice. Whole grain or whole wheat pasta. Quinoa, bulgur, and whole grain cereals. Low-sodium cereals. Corn or whole wheat flour tortillas. Whole grain cornbread. Whole grain crackers. Low-sodium crackers. Vegetables Fresh or frozen vegetables (raw, steamed, roasted, or grilled). Low-sodium or reduced-sodium tomato and vegetable juices. Low-sodium or reduced-sodium tomato sauce and paste. Low-sodium or reduced-sodium canned vegetables.  Fruits All fresh, canned (in natural juice), or frozen fruits. Meat and Other Protein Products Ground beef (85% or leaner), grass-fed beef, or beef trimmed of fat. Skinless chicken or [place]key. Ground chicken or [place]key. Pork trimmed of fat. All fish and seafood. Eggs. Dried beans, peas, or lentils. Unsalted nuts and seeds. Unsalted canned beans. Dairy Low-fat dairy products, such as skim or 1% milk, 2% or reduced-fat cheeses, low-fat ricotta or cottage cheese, or plain low-fat yogurt. Low-sodium or reduced-sodium cheeses. Fats and Oils Tub margarines without trans fats. Light or reduced-fat mayonnaise and salad dressings (reduced sodium). Avocado. Safflower, olive, or canola oils. Natural peanut or almond butter. Other Unsalted popcorn and pretzels. The items listed above may not be a complete list of recommended foods or beverages. Contact your dietitian for more options. WHAT FOODS ARE NOT RECOMMENDED? Grains White bread. White pasta. White rice. Refined cornbread. Bagels and croissants. Crackers that contain trans fat. Vegetables Creamed or fried vegetables. Vegetables in a cheese sauce. Regular canned vegetables. Regular canned tomato sauce and paste. Regular tomato and vegetable juices. Fruits  Dried fruits. Canned fruit in light or heavy syrup. Fruit juice. Meat and Other Protein Products Fatty cuts of meat. Ribs, chicken wings, bacon, sausage,  bologna, salami, chitterlings, fatback, hot dogs, bratwurst, and packaged luncheon meats. Salted nuts and seeds. Canned beans with salt. Dairy Whole or 2% milk, cream, half-and-half, and cream cheese. Whole-fat or sweetened yogurt. Full-fat cheeses or blue cheese. Nondairy creamers and whipped toppings. Processed cheese, cheese spreads, or cheese curds. Condiments Onion and garlic salt, seasoned salt, table salt, and sea salt. Canned and packaged gravies. Worcestershire sauce. Tartar sauce. Barbecue sauce. Teriyaki sauce. Soy sauce, including reduced sodium. Steak sauce. Fish sauce. Oyster sauce. Cocktail sauce. Horseradish. Ketchup and mustard. Meat flavorings and tenderizers. Bouillon cubes. Hot sauce. Tabasco sauce. Marinades. Taco seasonings. Relishes. Fats and Oils Butter, stick margarine, lard, shortening, ghee, and bacon fat. Coconut, palm kernel, or palm oils. Regular salad dressings. Other Pickles and olives. Salted popcorn and pretzels. The items listed above may not be a complete list of foods and beverages to avoid. Contact your dietitian for more information. WHERE CAN I FIND MORE INFORMATION? National Heart, Lung, and Blood Institute: [employer]   This information is not intended to replace advice given to you by your health care provider. Make sure you discuss any questions you have with your health care provider.   Document Released: 12/26/2010 Document Revised: 01/27/2014 Document Reviewed: 11/10/2012 Elsevier Interactive Patient Education [employer]

## 2015-10-29 NOTE — Progress Notes (Signed)
Patient presents to clinic today c/o 2 weeks of elevated BP readings at home associated with headaches. Patient endorses BP measurements averaging around 160s-170s/80s.Is having checked by sister who is an Char[employer] fundraiserN with a manual cuff. [facility] significantly [facility]. Endorses some mild swelling of hands and feet. Denies chest pain, palpitations, SOB, lightheadedness or dizziness. Patient was previously on regimen of HCTZ 25 mg. Was discontinued as patient's BP was normotensive despite not taking medication for several months prior to initial visit with this provider.  Denies adding salt to food. Endorses drinking mainly water or gatorade.   Past Medical History:  Diagnosis Date  . Anxiety   . Arthritis   . Asthma   . Back pain   . Chronic mental illness   . Depression   . GERD (gastroesophageal reflux disease)   . Hyperlipemia   . Hypertension   . Migraines   . Neuromuscular disorder (HCC)    numbness lt leg  . Schizo-affective type schizophrenia, subchronic state (HCC)   . Seasonal allergies   . Sleep apnea    uses a cpap    Current Outpatient Prescriptions on File Prior to Visit  Medication Sig Dispense Refill  . albuterol (VENTOLIN HFA) 108 (90 Base) MCG/ACT inhaler Inhale 1-2 puffs into the lungs every 6 (six) hours as needed for wheezing or shortness of breath. 1 Inhaler 5  . atorvastatin (LIPITOR) 20 MG tablet Take 1 tablet (20 mg total) by mouth daily at 6 PM. 30 tablet 2  . baclofen (LIORESAL) 10 MG tablet Take 1 tablet (10 mg total) by mouth 3 (three) times daily. 90 each 3  . bag balm OINT ointment Apply 1 application topically as needed for dry skin.    . clonazePAM (KLONOPIN) 0.5 MG tablet Take 1 tablet (0.5 mg total) by mouth 2 (two) times daily as needed. Reported on 07/09/2015 30 tablet 0  . clotrimazole-betamethasone (LOTRISONE) cream Apply 1 application topically 2 (two) times daily. 30 g 0  . ketoconazole (NIZORAL) 2 % cream Apply 1  application topically daily. 60 g 2  . meloxicam (MOBIC) 15 MG tablet Take 1 tablet (15 mg total) by mouth daily. 20 tablet 0  . omeprazole (PRILOSEC) 20 MG capsule Take 1 capsule (20 mg total) by mouth 2 (two) times daily. Reported on 07/02/2015 60 capsule 2  . traMADol (ULTRAM) 50 MG tablet Take 1 tablet (50 mg total) by mouth every 6 (six) hours as needed. Reported on 07/02/2015 30 tablet 0  . triamcinolone (NASACORT) 55 MCG/ACT AERO nasal inhaler Place 2 sprays into the nose daily. 1 Inhaler 2   No current facility-administered medications on file prior to visit.     No Known Allergies  Family History  Problem Relation Age of Onset  . Arthritis Mother 7052    Deceased  . Hyperlipidemia    . Heart disease    . Hypertension    . Sudden death    . Diabetes Mother   . Healthy Father     living  . Hypertension Maternal Grandfather   . Hypertension Maternal Grandmother   . Arthritis Maternal Grandfather   . Diabetes Maternal Grandmother   . Hypertension Maternal Aunt   . Diabetes    . Lupus    . Arthritis Maternal Aunt   . Cancer Maternal Aunt     Deceased  . Heart disease Maternal Aunt     x2  . Mental illness Other     Maternal aunt  .  Healthy Brother     x2  . Other Sister     Chronic?  . Mental illness Son     x2  . Healthy Daughter     x1  . Healthy Son     x2    Social History   Social History  . Marital status: Married    Spouse name: N/A  . Number of children: N/A  . Years of education: N/A   Social History Main Topics  . Smoking status: Former Smoker    Packs/day: 1.00    Quit date: 07/09/2013  . Smokeless tobacco: None  . Alcohol use No     Comment: not since 2010  . Drug use: No  . Sexual activity: Not Asked   Other Topics Concern  . None   Social History Narrative  . None   Review of Systems - See HPI.  All other ROS are negative.  BP 108/76 (BP Location: Right Arm, Patient Position: Sitting, Cuff Size: Large)   Pulse 98   Temp 98.2 F  (36.8 C) (Oral)   Resp 16   Ht 5\' 7"  (1.702 m)   Wt 231 lb (104.8 kg)   SpO2 98%   BMI 36.18 kg/m   Physical Exam  Constitutional: He is well-developed, well-nourished, and in no distress.  HENT:  Head: Normocephalic and atraumatic.  Eyes: Conjunctivae are normal.  Neck: Neck supple.  Cardiovascular: Normal rate, regular rhythm, normal heart sounds and intact distal pulses.   Pulses:      Popliteal pulses are 2+ on the right side, and 2+ on the left side.       Dorsalis pedis pulses are 2+ on the right side, and 2+ on the left side.       Posterior tibial pulses are 2+ on the right side, and 2+ on the left side.  No edema noted on examination.  Pulmonary/Chest: Effort normal and breath sounds normal. No respiratory distress. He has no wheezes. He has no rales. He exhibits no tenderness.  Skin: Skin is warm and dry. No rash noted.  Psychiatric: Affect normal.  Vitals reviewed.  Assessment/Plan: 1. Essential hypertension BP normotensive at present. Patient with multiple elevated BP on manual check at with check by pharmacist over the past several weeks. Has had symptoms of hypertension. No alarm symptoms. Will start HCTZ 12.5 mg daily. He is to check BP prior to taking medication. If <130/90 he is to not take. He is also to check BP 2 hours after taking medication and record. Bring to FU in 2 weeks. - hydrochlorothiazide (MICROZIDE) 12.5 MG capsule; Take 1 capsule (12.5 mg total) by mouth daily.  Dispense: 30 capsule; Refill: 0   Piedad Climes, [place]

## 2015-10-29 NOTE — Telephone Encounter (Signed)
Thank you. I will see him at his visit.

## 2015-10-29 NOTE — Telephone Encounter (Signed)
We stopped medication as he had normal BP without taking the medication. He would need a follow-up appointment before we would consider medication

## 2015-10-29 NOTE — Telephone Encounter (Signed)
Rx request to pharmacy/[facility]  

## 2015-10-29 NOTE — Telephone Encounter (Signed)
Pt coming in this afternoon for acute due to swelling and headaches. He thinks it is bc he is not taking BP meds.

## 2015-10-29 NOTE — Telephone Encounter (Signed)
Caller name: Lorella NimrodHarvey Relationship to patient: self Can be reached: [phone] Pharmacy: [facility] [phone] - [place], [place] - [address]  Reason for call: Pt called for refill on hydrochlorithiazide. He states he's been out for 45 days or so. He said it's for BP. I don't see on his med list. Pt states some things were changed before and maybe this was one but he's not sure.

## 2015-10-29 NOTE — Progress Notes (Signed)
Pre visit review using our clinic review tool, if applicable. No additional management support is needed unless otherwise documented below in the visit note/SLS  

## 2015-11-01 ENCOUNTER — Encounter: Payer: Self-pay | Admitting: Podiatry

## 2015-11-01 ENCOUNTER — Ambulatory Visit (INDEPENDENT_AMBULATORY_CARE_PROVIDER_SITE_OTHER): Payer: Medicare Other | Admitting: Podiatry

## 2015-11-01 DIAGNOSIS — M722 Plantar fascial fibromatosis: Secondary | ICD-10-CM

## 2015-11-01 DIAGNOSIS — M79672 Pain in left foot: Secondary | ICD-10-CM | POA: Diagnosis not present

## 2015-11-01 DIAGNOSIS — G8929 Other chronic pain: Secondary | ICD-10-CM

## 2015-11-01 MED ORDER — DESOXIMETASONE 0.25 % EX CREA: 1.0000 "application " | TOPICAL_CREAM | Freq: Two times a day (BID) | CUTANEOUS | 0 refills | Status: DC

## 2015-11-01 NOTE — Progress Notes (Signed)
Subjective: 50 year old male presents the office today to discuss MRI results of the left foot. He states he has had chronic heel pain for several years. He's had multiple conservative treatments including shoe gear modifications, orthotics, stretching, icing, medications, physical therapy, immobilization in a CAM boot for a significant improvement in symptoms. He does continue to get pain to the bottom of his heel describes as a throbbing sensation mostly otherwise been gets up until today when he walks. He denies any numbness or tingling. The pain does not wake him up at night. Denies any claudication symptoms. Denies any systemic complaints such as fevers, chills, nausea, vomiting. No acute changes since last appointment, and no other complaints at this time.   Objective: AAO x3, NAD DP/PT pulses palpable bilaterally, CRT less than 3 seconds Negative tinel sign  Tenderness to palpation along the plantar medial tubercle of the calcaneus at the insertion of plantar fascia on the left foot. There is no pain along the course of the plantar fascia within the arch of the foot. Plantar fascia appears to be intact. There is no pain with lateral compression of the calcaneus or pain with vibratory sensation. There is no pain along the course or insertion of the achilles tendon. No other areas of tenderness to bilateral lower extremities. No significant equinus No edema, erythema, increase in warmth to bilateral lower extremities. No open lesions or pre-ulcerative lesions.  No pain with calf compression, swelling, warmth, erythema  Assessment: Chronic plantar fasciitis left heel  Plan: -All treatment options discussed with the patient including all alternatives, risks, complications.  -I discussed the conservative and surgical treatment options. I discussed with him other treatment options including physical therapy which is artery done as well as shockwave therapy. Also discussed new custom insert. He  states he cannot afford the insert at this time. He wishes to proceed with surgical intervention. He states he was scheduled for surgery several years ago for this but due to circumstances this was canceled. -Left EPF scheduled -The incision placement as well as the postoperative course was discussed with the patient. I discussed risks of the surgery which include, but not limited to, infection, bleeding, pain, swelling, need for further surgery, delayed or nonhealing, painful or ugly scar, numbness or sensation changes, over/under correction, recurrence, transfer lesions, further deformity, hardware failure, DVT/PE, loss of toe/foot. Patient understands these risks and wishes to proceed with surgery. The surgical consent was reviewed with the patient all 3 pages were signed. No promises or guarantees were given to the outcome of the procedure. All questions were answered to the best of my ability. Before the surgery the patient was encouraged to call the office if there is any further questions. [facility]. -Patient encouraged to call the office with any questions, concerns, change in symptoms.   Ovid CurdMatthew Paris Hohn, DPM

## 2015-11-01 NOTE — Patient Instructions (Signed)
Endoscopic Plantar Fasciotomy On the underside of the foot and heel is a tight band of tissue called the plantar fascia. Sometimes the plantar fascia become inflamed (the body's way of reacting to injury, overuse or infection) which produces pain. The condition is known as plantar fasciitis.  One way to treat plantar fasciitis is through an endoscopic plantar fasciotomy. This is surgery to reduce the tension on the plantar fascia. However, it is a minimally invasive surgery because there will be no large incision. Instead, the surgeon inserts a thin, flexible tube through a small (1/16th of an inch (1.59 mm)) cut in your skin. The surgeon can examine and release the fascia through this tube. Recovery from an endoscopic fasciotomy is usually less painful and faster than from open surgery. LET YOUR CAREGIVER KNOW ABOUT:  Any allergies.  All medications you are taking, including:  Herbs, eyedrops, over-the-counter medications and creams.  Blood thinners (anticoagulants) or other drugs that could affect blood clotting.  Use of steroids (by mouth or as creams).  Previous problems with anesthetics, including local anesthetics.  Possibility of pregnancy, if this applies.  Any history of blood clots.  Any history of bleeding or other blood problems.  Previous surgery.  Smoking history.  Other health problems.  Family history of anesthetic problems RISKS AND COMPLICATIONS  Short-term possibilities include:  Excessive bleeding.  Pain.  Loss of feeling (numbness) at the site of the incision.  Hematoma, a pooling of blood in the wound.  Infection.  Slow resolution of the symptoms. Longer-term possibilities include:  Scarring.  A return of the condition that led to fasciotomy.  Damage to nerves in the area.  Weakness in your foot.  Need for additional surgery. BEFORE THE PROCEDURE  Ask whether you need to get shoes that will support your heel and arch while you  recover.  7 to 10 days before the surgery, stop using aspirin and non-steroidal anti-inflammatory drugs (NSAIDs) for pain relief. This includes prescription drugs and over-the-counter drugs such as ibuprofen and naproxen.  If you take blood-thinners, ask your healthcare provider when you should stop taking them.  Do not eat or drink for about 8 hours before your surgery.  You might be asked to shower or wash with a special antibacterial soap before the procedure.  Arrive 1-2 hours before the surgery, or whenever your surgeon recommends. This will give you time to check in and fill out any needed paperwork.  If your surgery is an outpatient procedure, you will be able to go home the same day. Make arrangements in advance for someone to drive you home. PROCEDURE  You may be given general anesthesia (you will be asleep), regional anesthesia (your leg will be numbed) or local anesthesia (just the area around the fascia will be numbed). With regional and local anesthesia you will be given medication to make you groggy but awake during the procedure.  Your foot will be cleaned and sterilized.  The surgeon will make a cut (incision) on the side of your heel. Then a thin tube that contains a tiny camera will be inserted into the space. The camera makes it possible for the surgeon to see what is happening inside your foot.  The surgeon will work through this tube to release the fascia.  The tube will be removed, and dressing will be applied to the incisions. AFTER THE PROCEDURE After the procedure, you will be taken to another room to recover. People usually go home the same day. Before leaving, make sure   you have detailed instructions on how to care for the incision. Also, you may be given crutches and shown how to use them. Ask your surgeon whether physical therapy will be needed.  HOME CARE INSTRUCTIONS   Take any prescription medication for pain and/or nausea that your surgeon prescribes.  Follow the directions carefully and take all of the medication.  Ask your surgeon whether you can take over-the-counter medicines for pain, discomfort or fever. Do not take aspirin unless your healthcare provider says to. Aspirin can increase the chances of bleeding.  You may need to put ice on your foot for 10 to 15 minutes each day for several days.  While you are resting, keep your foot elevated above the level of your heart.  Do not get the incisions wet for the first few days after surgery (or until the surgeon tells you it is OK).  Avoid standing or walking for long periods. Your healthcare provider will tell you when you are clear to resume normal activity. If your job requires a lot of standing or walking, ask to be assigned to a less active position for about 8 weeks.  When you are up and about, wear shoes with a supportive heel and arch support. Soft running shoes may be recommended for the first two weeks of recovery. SEEK MEDICAL CARE IF:   The wound becomes red or swollen.  The wound leaks fluid or blood.  Your pain increases.  You become nauseous or vomit for more than two days after the surgery.  You have pain or difficulty moving your foot.  You develop a fever of more than 100.5 F (38.1 C). SEEK IMMEDIATE MEDICAL CARE IF:   Your leg or foot starts to swell.  You develop a fever of 102.0 F (38.9 C) or higher.   This information is not intended to replace advice given to you by your health care provider. Make sure you discuss any questions you have with your health care provider.   Document Released: 11/03/2008 Document Revised: 03/31/2011 Document Reviewed: 09/19/2014 Elsevier Interactive Patient Education 2016 [employer]  Pre-Operative Instructions  Congratulations, you have decided to take an important step to improving your quality of life.  You can be assured that the doctors of [facility] will be with you every step of the way.  1. Plan to  be at the surgery center/hospital at least 1 (one) hour prior to your scheduled time unless otherwise directed by the surgical center/hospital staff.  You must have a responsible adult accompany you, remain during the surgery and drive you home.  Make sure you have directions to the surgical center/hospital and know how to get there on time. 2. For hospital based surgery you will need to obtain a history and physical form from your family physician within 1 month prior to the date of surgery- we will give you a form for you primary physician.  3. We make every effort to accommodate the date you request for surgery.  There are however, times where surgery dates or times have to be moved.  We will contact you as soon as possible if a change in schedule is required.   4. No Aspirin/Ibuprofen for one week before surgery.  If you are on aspirin, any non-steroidal anti-inflammatory medications (Mobic, Aleve, Ibuprofen) you should stop taking it 7 days prior to your surgery.  You make take Tylenol  For pain prior to surgery.  5. Medications- If you are taking daily heart and blood pressure  medications, seizure, reflux, allergy, asthma, anxiety, pain or diabetes medications, make sure the surgery center/hospital is aware before the day of surgery so they may notify you which medications to take or avoid the day of surgery. 6. No food or drink after midnight the night before surgery unless directed otherwise by surgical center/hospital staff. 7. No alcoholic beverages 24 hours prior to surgery.  No smoking 24 hours prior to or 24 hours after surgery. 8. Wear loose pants or shorts- loose enough to fit over bandages, boots, and casts. 9. No slip on shoes, sneakers are best. 10. Bring your boot with you to the surgery center/hospital.  Also bring crutches or a walker if your physician has prescribed it for you.  If you do not have this equipment, it will be provided for you after surgery. 11. If you have not been  contracted by the surgery center/hospital by the day before your surgery, call to confirm the date and time of your surgery. 12. Leave-time from work may vary depending on the type of surgery you have.  Appropriate arrangements should be made prior to surgery with your employer. 13. Prescriptions will be provided immediately following surgery by your doctor.  Have these filled as soon as possible after surgery and take the medication as directed. 14. Remove nail polish on the operative foot. 15. Wash the night before surgery.  The night before surgery wash the foot and leg well with the antibacterial soap provided and water paying special attention to beneath the toenails and in between the toes.  Rinse thoroughly with water and dry well with a towel.  Perform this wash unless told not to do so by your physician.  Enclosed: 1 Ice pack (please put in freezer the night before surgery)   1 Hibiclens skin cleaner   Pre-op Instructions  If you have any questions regarding the instructions, do not hesitate to call our office at any point during this process.   [place]: [address] [place], [place] [address] [phone]  [place]: [address] [place], [place] [address] [phone]  Dr. Ovid Curd, DPM

## 2015-11-05 ENCOUNTER — Telehealth: Payer: Self-pay | Admitting: *Deleted

## 2015-11-05 NOTE — Telephone Encounter (Signed)
"  I need to set up my surgery."  When would you like to schedule?  "This week if I can.  Go ahead and nip it in the bud."  He can do it this week.  You should get a call from someone from the surgical center either today or tomorrow.  They will give you the arrival time.  Do not eat or drink anything after midnight.  Someone will need to go with you because you will be under a local / IV sedation.  If you have access to a computer, go ahead and register with the surgical center.  Instructions are in the surgical center brochure located in you blue bag.

## 2015-11-06 ENCOUNTER — Telehealth: Payer: Self-pay | Admitting: *Deleted

## 2015-11-06 NOTE — Telephone Encounter (Signed)
I attempted to call patient on mobile mailbox was full couldn't leave a message.  I called home number and it was busy. [facility] needs [facility] and Medicaid to [place] office so it can be put into Epic.  Patient is scheduled for surgery on 11/07/2015.  (Tammy or Jasmine can one of you get updated insurance information from patient please.)

## 2015-11-06 NOTE — Telephone Encounter (Signed)
Pt states he has surgery tomorrow and the surgery center has not called him. I called pt and gave [facility] [phone]. I informed Maralyn SagoSarah - [facility] that pt had not been contacted and gave his phone number.

## 2015-11-13 ENCOUNTER — Telehealth: Payer: Self-pay | Admitting: Physician Assistant

## 2015-11-13 ENCOUNTER — Encounter: Payer: Self-pay | Admitting: Physician Assistant

## 2015-11-13 ENCOUNTER — Ambulatory Visit (INDEPENDENT_AMBULATORY_CARE_PROVIDER_SITE_OTHER): Payer: Medicare Other | Admitting: Physician Assistant

## 2015-11-13 VITALS — BP 112/77 | HR 62 | Temp 98.2°F | Ht 67.0 in | Wt 235.8 lb

## 2015-11-13 DIAGNOSIS — I10 Essential (primary) hypertension: Secondary | ICD-10-CM | POA: Diagnosis not present

## 2015-11-13 DIAGNOSIS — M1712 Unilateral primary osteoarthritis, left knee: Secondary | ICD-10-CM

## 2015-11-13 MED ORDER — HYDROCHLOROTHIAZIDE 12.5 MG PO CAPS: 12.5000 mg | ORAL_CAPSULE | Freq: Every day | ORAL | 5 refills | Status: DC

## 2015-11-13 MED ORDER — HYDROCODONE-ACETAMINOPHEN 5-325 MG PO TABS: 1.0000 | ORAL_TABLET | Freq: Four times a day (QID) | ORAL | 0 refills | Status: DC | PRN

## 2015-11-13 NOTE — Patient Instructions (Signed)
Please continue blood pressure medication as directed. We will recheck your BP in 2 months.  Come fasting to that appointment for blood work as well.  Start the new pain medication as directed only if needed for severe pain. Your Orthopedist will have to take over medication once you have your surgery.

## 2015-11-13 NOTE — Telephone Encounter (Signed)
Pt would like to switch providers to Dr. Carmelia RollerWendling from [facility].

## 2015-11-13 NOTE — Progress Notes (Signed)
Pre visit review using our clinic review tool, if applicable. No additional management support is needed unless otherwise documented below in the visit note/SLS  

## 2015-11-13 NOTE — Progress Notes (Signed)
Pre visit review using our clinic review tool, if applicable. No additional management support is needed unless otherwise documented below in the visit note. 

## 2015-11-13 NOTE — Telephone Encounter (Signed)
Ok with me 

## 2015-11-13 NOTE — Progress Notes (Signed)
Patient presents to clinic today for follow-up of hypertension after starting HCTZ 12.5 mg daily. Is taking as directed. Is checking BP at home and notes averaging 118-120/0s. Patient denies chest pain, palpitations, lightheadedness, dizziness, vision changes or frequent headaches.  BP Readings from Last 3 Encounters:  11/13/15 112/77  10/29/15 108/76  09/05/15 128/88   Patient also with upcoming L knee surgery 2/2 significant OA. Surgery is scheduled for 1st week in November. Patient currently taking Tramadol for severe pain with no relief in symptoms. Patient states Orthopedist deferred any treatment for pain to primary care until day of operation.   Past Medical History:  Diagnosis Date  . Anxiety   . Arthritis   . Asthma   . Back pain   . Chronic mental illness   . Depression   . GERD (gastroesophageal reflux disease)   . Hyperlipemia   . Hypertension   . Migraines   . Neuromuscular disorder (HCC)    numbness lt leg  . Schizo-affective type schizophrenia, subchronic state (HCC)   . Seasonal allergies   . Sleep apnea    uses a cpap    Current Outpatient Prescriptions on File Prior to Visit  Medication Sig Dispense Refill  . albuterol (VENTOLIN HFA) 108 (90 Base) MCG/ACT inhaler Inhale 1-2 puffs into the lungs every 6 (six) hours as needed for wheezing or shortness of breath. 1 Inhaler 5  . atorvastatin (LIPITOR) 20 MG tablet TAKE 1 TABLET BY MOUTH DAILY AT 6 PM 30 tablet 2  . baclofen (LIORESAL) 10 MG tablet Take 1 tablet (10 mg total) by mouth 3 (three) times daily. 90 each 3  . bag balm OINT ointment Apply 1 application topically as needed for dry skin.    . clonazePAM (KLONOPIN) 0.5 MG tablet Take 1 tablet (0.5 mg total) by mouth 2 (two) times daily as needed. Reported on 07/09/2015 30 tablet 0  . clotrimazole-betamethasone (LOTRISONE) cream Apply 1 application topically 2 (two) times daily. 30 g 0  . desoximetasone (TOPICORT) 0.25 % cream Apply 1 application  topically 2 (two) times daily. 30 g 0  . hydrochlorothiazide (MICROZIDE) 12.5 MG capsule Take 1 capsule (12.5 mg total) by mouth daily. 30 capsule 0  . ketoconazole (NIZORAL) 2 % cream Apply 1 application topically daily. 60 g 2  . meloxicam (MOBIC) 15 MG tablet Take 1 tablet (15 mg total) by mouth daily. 20 tablet 0  . omeprazole (PRILOSEC) 20 MG capsule Take 1 capsule (20 mg total) by mouth 2 (two) times daily. Reported on 07/02/2015 60 capsule 2  . triamcinolone (NASACORT) 55 MCG/ACT AERO nasal inhaler Place 2 sprays into the nose daily. 1 Inhaler 2   No current facility-administered medications on file prior to visit.     No Known Allergies  Family History  Problem Relation Age of Onset  . Arthritis Mother 57    Deceased  . Hyperlipidemia    . Heart disease    . Hypertension    . Sudden death    . Diabetes Mother   . Healthy Father     living  . Hypertension Maternal Grandfather   . Hypertension Maternal Grandmother   . Arthritis Maternal Grandfather   . Diabetes Maternal Grandmother   . Hypertension Maternal Aunt   . Diabetes    . Lupus    . Arthritis Maternal Aunt   . Cancer Maternal Aunt     Deceased  . Heart disease Maternal Aunt     x2  . Mental  illness Other     Maternal aunt  . Healthy Brother     x2  . Other Sister     Chronic?  . Mental illness Son     x2  . Healthy Daughter     x1  . Healthy Son     x2    Social History   Social History  . Marital status: Married    Spouse name: N/A  . Number of children: N/A  . Years of education: N/A   Social History Main Topics  . Smoking status: Former Smoker    Packs/day: 1.00    Quit date: 07/09/2013  . Smokeless tobacco: None  . Alcohol use No     Comment: not since 2010  . Drug use: No  . Sexual activity: Not Asked   Other Topics Concern  . None   Social History Narrative  . None   Review of Systems - See HPI.  All other ROS are negative.  BP 112/77 (BP Location: Left Arm, Patient  Position: Sitting, Cuff Size: Large)   Pulse 62   Temp 98.2 F (36.8 C) (Oral)   Ht 5\' 7"  (1.702 m)   Wt 235 lb 12.8 oz (107 kg)   SpO2 97%   BMI 36.93 kg/m   Physical Exam  Constitutional: He is oriented to person, place, and time and well-developed, well-nourished, and in no distress.  HENT:  Head: Normocephalic and atraumatic.  Eyes: Conjunctivae are normal.  Cardiovascular: Normal rate, regular rhythm, normal heart sounds and intact distal pulses.   Pulmonary/Chest: Effort normal and breath sounds normal. No respiratory distress. He has no wheezes. He has no rales. He exhibits no tenderness.  Neurological: He is alert and oriented to person, place, and time.  Skin: Skin is warm and dry. No rash noted.  Psychiatric: Affect normal.  Vitals reviewed.  Assessment/Plan: 1. Essential hypertension BP much improved. Will continue HCTZ. DASH diet. FU scheduled. - hydrochlorothiazide (MICROZIDE) 12.5 MG capsule; Take 1 capsule (12.5 mg total) by mouth daily.  Dispense: 30 capsule; Refill: 5  2. Primary osteoarthritis of left knee Will give short course of pain medication to last until procedure. Once he has surgery, will be up to specialist to continue medications.   Piedad ClimesMartin, Vong Garringer Cody, PA-C

## 2015-11-14 NOTE — Telephone Encounter (Signed)
OK 

## 2015-11-21 ENCOUNTER — Encounter: Payer: Self-pay | Admitting: Podiatry

## 2015-11-21 ENCOUNTER — Telehealth: Payer: Self-pay | Admitting: Family Medicine

## 2015-11-21 DIAGNOSIS — M722 Plantar fascial fibromatosis: Secondary | ICD-10-CM | POA: Diagnosis not present

## 2015-11-21 DIAGNOSIS — M25572 Pain in left ankle and joints of left foot: Secondary | ICD-10-CM | POA: Diagnosis not present

## 2015-11-21 DIAGNOSIS — E7800 Pure hypercholesterolemia, unspecified: Secondary | ICD-10-CM | POA: Diagnosis not present

## 2015-11-21 MED ORDER — DESOXIMETASONE 0.25 % EX CREA: 1.0000 "application " | TOPICAL_CREAM | Freq: Two times a day (BID) | CUTANEOUS | 0 refills | Status: DC

## 2015-11-21 NOTE — Telephone Encounter (Signed)
Received form from [employer] wanting me to authorize test strips, etc.  However pt does not have DM and does not need to check his glucose  Lab Results  Component Value Date   HGBA1C 5.9 07/02/2015   Confirmed with Selena BattenCody that the does not have dx of DM. These supplies are not necessary and this may be a scam.  Will discard forms

## 2015-11-22 ENCOUNTER — Encounter: Payer: Self-pay | Admitting: Podiatry

## 2015-11-22 NOTE — Telephone Encounter (Signed)
Pt has been scheduled.  °

## 2015-11-29 ENCOUNTER — Encounter: Payer: Self-pay | Admitting: Podiatry

## 2015-11-29 ENCOUNTER — Ambulatory Visit (INDEPENDENT_AMBULATORY_CARE_PROVIDER_SITE_OTHER): Payer: Medicare Other | Admitting: Podiatry

## 2015-11-29 VITALS — BP 119/73 | HR 66 | Temp 97.9°F | Resp 16

## 2015-11-29 DIAGNOSIS — M722 Plantar fascial fibromatosis: Secondary | ICD-10-CM

## 2015-11-29 DIAGNOSIS — Z9889 Other specified postprocedural states: Secondary | ICD-10-CM

## 2015-11-29 MED ORDER — OXYCODONE-ACETAMINOPHEN 5-325 MG PO TABS: 1.0000 | ORAL_TABLET | Freq: Three times a day (TID) | ORAL | 0 refills | Status: DC | PRN

## 2015-11-29 NOTE — Progress Notes (Signed)
Subjective: Danny Mcdowell is a 50 y.o. is seen today in office s/p left EPF preformed on 11/21/15. They state their pain is controlled. He has decreased the amount of pain medication he has been taking. He has been wearing the CAM boot. He has been up on his foot more, especially yesterday as there was a "fugutive on the loose and I was helping out".  Denies any systemic complaints such as fevers, chills, nausea, vomiting. No calf pain, chest pain, shortness of breath.   Objective: General: No acute distress, AAOx3  DP/PT pulses palpable 2/4, CRT < 3 sec to all digits.  Protective sensation intact. Motor function intact.  Left foot: Incision is well coapted without any evidence of dehiscence and sutures are intact. There is no surrounding erythema, ascending cellulitis, fluctuance, crepitus, malodor, drainage/purulence. There is minimal edema around the surgical site. There is minimal pain along the surgical site.  No other areas of tenderness to bilateral lower extremities.  No other open lesions or pre-ulcerative lesions.  No pain with calf compression, swelling, warmth, erythema.   Assessment and Plan:  Status post EPF, doing well with no complications   -Treatment options discussed including all alternatives, risks, and complications -Antibiotic ointment was applied followed by a DSD. Keep clean, dry, intact.  -Ice/elevation -Pain medication as needed. Refilled percocet today -CAM boot at all times, even at night (he has not been wearing at night) -Monitor for any clinical signs or symptoms of infection and DVT/PE and directed to call the office immediately should any occur or go to the ER. -Follow-up in 1 week for possible suture removal or sooner if any problems arise. In the meantime, encouraged to call the office with any questions, concerns, change in symptoms.   Danny Mcdowell, DPM

## 2015-12-06 ENCOUNTER — Encounter: Payer: Self-pay | Admitting: Podiatry

## 2015-12-06 ENCOUNTER — Ambulatory Visit (INDEPENDENT_AMBULATORY_CARE_PROVIDER_SITE_OTHER): Payer: Medicare Other | Admitting: Podiatry

## 2015-12-06 DIAGNOSIS — M722 Plantar fascial fibromatosis: Secondary | ICD-10-CM

## 2015-12-06 DIAGNOSIS — Z9889 Other specified postprocedural states: Secondary | ICD-10-CM

## 2015-12-06 NOTE — Progress Notes (Signed)
   Subjective:    Patient ID: Danny Mcdowell, male    DOB: 1965-05-21, 50 y.o.   MRN: [id number]  HPI 50 year old male presents the office today status post left endoscopic plantar fascial release performed on 11/21/2015. He states these remain in a cam boot placed on quite a bit of walking. He denies any pain. He states that he is doing very well. He denies any systemic complaints such as fevers, chills, nausea, vomiting. No calf pain, chest pain, shortness of breath. No other complaints at this time.   Review of Systems  All other systems reviewed and are negative.      Objective:   Physical Exam General: AAO x3, NAD  Dermatological:Incision of both the medial and lateral aspect of the heel are well coapted without any evidence of dehiscence and sutures are intact. There is localized edema, mild. There is no erythema, increase in warmth. There is no drainage or pus. There is no clinical signs of infection.   Vascular : DP/PT pulses 2/4, CRT less than 3 seconds.  There is no pain with calf compression, swelling, warmth, erythema.   Neruologic: Grossly intact via light touch bilateral.   Musculoskeletal:mild tenderness to palpation directly on the incisions however there is significant improvement tenderness on the plantar fascia on the plantar aspect of the heel. There is no other areas of tenderness. Muscular strength 5/5 in all groups tested bilateral.  Gait: Unassisted, Nonantalgic.      Assessment & Plan:   50 year old male status post endoscopic plantar fascial release left foot  -Treatment options discussed including all alternatives, risks, and complications -Etiology of symptoms were discussed -Sutures removed today without complications. Antibiotic ointment and a bandage was applied followed by a bandage. Continue this at home as well.  -Continue cam boot at all times. He inserted transition to a regular shoe and x-ray muscular cam boot at night. Hold off on  basketball physical activities. Ice and elevation. Stretching exercises.  -Follow-up in 4 weeks or sooner if needed. Call any questions or concerns in the meantime.   Ovid CurdMatthew Wagoner, DPM

## 2015-12-14 ENCOUNTER — Other Ambulatory Visit: Payer: Self-pay | Admitting: Physician Assistant

## 2015-12-14 DIAGNOSIS — I10 Essential (primary) hypertension: Secondary | ICD-10-CM

## 2015-12-19 ENCOUNTER — Telehealth: Payer: Self-pay | Admitting: Emergency Medicine

## 2015-12-19 MED ORDER — CLONAZEPAM 0.5 MG PO TABS: 0.5000 mg | ORAL_TABLET | Freq: Two times a day (BID) | ORAL | 0 refills | Status: DC | PRN

## 2015-12-19 NOTE — Telephone Encounter (Signed)
Spoke with patient and he did not request the braces. Will discard the faxed forms Advised patient will phone in the [facility]epam #[facility]. Patient will call to reschedule his appt with Dr Carmelia RollerWendling due to appt conflict   LMOM advising if patient wanted the braces. After speaking with Danny Mcdowell, he oked #15 of the Clonazepam until patient is able to see new provider in Dec.  [facility] requesting for Right shoulder/elbow brace, Back brace, bilateral wrist braces. If patient did request forms will need an appointment to discuss other pain.  [facility] request for Clonazepam 0.5 mg bid prn.

## 2015-12-28 ENCOUNTER — Encounter: Payer: Self-pay | Admitting: Podiatry

## 2016-01-09 ENCOUNTER — Telehealth: Payer: Self-pay | Admitting: Behavioral Health

## 2016-01-09 NOTE — Telephone Encounter (Signed)
Unable to reach patient at time of Pre-Visit Call.  Left message for patient to return call when available.    

## 2016-01-09 NOTE — Progress Notes (Signed)
DOS 11.01.2017 Left Foot Plantar Fascia Release

## 2016-01-10 ENCOUNTER — Other Ambulatory Visit: Payer: Self-pay | Admitting: Family Medicine

## 2016-01-10 ENCOUNTER — Ambulatory Visit: Payer: Medicare Other | Admitting: Family Medicine

## 2016-01-10 ENCOUNTER — Encounter: Payer: Medicare Other | Admitting: Podiatry

## 2016-02-12 ENCOUNTER — Other Ambulatory Visit: Payer: Self-pay | Admitting: Physician Assistant

## 2016-02-12 DIAGNOSIS — I10 Essential (primary) hypertension: Secondary | ICD-10-CM

## 2016-04-04 ENCOUNTER — Encounter (HOSPITAL_COMMUNITY): Payer: Self-pay

## 2016-04-04 DIAGNOSIS — I10 Essential (primary) hypertension: Secondary | ICD-10-CM | POA: Insufficient documentation

## 2016-04-04 DIAGNOSIS — J45909 Unspecified asthma, uncomplicated: Secondary | ICD-10-CM | POA: Insufficient documentation

## 2016-04-04 DIAGNOSIS — G43809 Other migraine, not intractable, without status migrainosus: Secondary | ICD-10-CM | POA: Insufficient documentation

## 2016-04-04 DIAGNOSIS — Z79899 Other long term (current) drug therapy: Secondary | ICD-10-CM | POA: Insufficient documentation

## 2016-04-04 DIAGNOSIS — R51 Headache: Secondary | ICD-10-CM | POA: Diagnosis present

## 2016-04-04 DIAGNOSIS — Z87891 Personal history of nicotine dependence: Secondary | ICD-10-CM | POA: Insufficient documentation

## 2016-04-04 LAB — CBC
HEMATOCRIT: 42.1 % (ref 39.0–52.0)
Hemoglobin: 14.6 g/dL (ref 13.0–17.0)
MCH: 30.9 pg (ref 26.0–34.0)
MCHC: 34.7 g/dL (ref 30.0–36.0)
MCV: 89.2 fL (ref 78.0–100.0)
Platelets: 267 10*3/uL (ref 150–400)
RBC: 4.72 MIL/uL (ref 4.22–5.81)
RDW: 13.1 % (ref 11.5–15.5)
WBC: 7.8 10*3/uL (ref 4.0–10.5)

## 2016-04-04 NOTE — ED Triage Notes (Signed)
Pt states that he started having headache and CP that started today. Vomited x 3, some SOB. Denies dizziness

## 2016-04-05 ENCOUNTER — Emergency Department (HOSPITAL_COMMUNITY): Payer: Medicare Other

## 2016-04-05 ENCOUNTER — Emergency Department (HOSPITAL_COMMUNITY)
Admission: EM | Admit: 2016-04-05 | Discharge: 2016-04-05 | Disposition: A | Discharge status: Home / Self Care | Payer: Medicare Other | Attending: Emergency Medicine | Admitting: Emergency Medicine

## 2016-04-05 DIAGNOSIS — G43809 Other migraine, not intractable, without status migrainosus: Secondary | ICD-10-CM | POA: Diagnosis not present

## 2016-04-05 LAB — BASIC METABOLIC PANEL
ANION GAP: 6 (ref 5–15)
BUN: 10 mg/dL (ref 6–20)
CHLORIDE: 106 mmol/L (ref 101–111)
CO2: 25 mmol/L (ref 22–32)
Calcium: 9.5 mg/dL (ref 8.9–10.3)
Creatinine, Ser: 0.79 mg/dL (ref 0.61–1.24)
Glucose, Bld: 98 mg/dL (ref 65–99)
POTASSIUM: 4.1 mmol/L (ref 3.5–5.1)
SODIUM: 137 mmol/L (ref 135–145)

## 2016-04-05 LAB — I-STAT TROPONIN, ED: TROPONIN I, POC: 0 ng/mL (ref 0.00–0.08)

## 2016-04-05 MED ORDER — NAPROXEN 500 MG PO TABS: 500.0000 mg | ORAL_TABLET | Freq: Two times a day (BID) | ORAL | 0 refills | Status: DC

## 2016-04-05 MED ORDER — DIPHENHYDRAMINE HCL 50 MG/ML IJ SOLN
12.5000 mg | Freq: Once | INTRAMUSCULAR | Status: AC
  Administered 2016-04-05: 12.5 mg via INTRAVENOUS
  Filled 2016-04-05: qty 1

## 2016-04-05 MED ORDER — PROCHLORPERAZINE EDISYLATE 5 MG/ML IJ SOLN
10.0000 mg | Freq: Once | INTRAMUSCULAR | Status: AC
  Administered 2016-04-05: 10 mg via INTRAVENOUS
  Filled 2016-04-05: qty 2

## 2016-04-05 NOTE — ED Notes (Signed)
Patient returned from CT

## 2016-04-05 NOTE — ED Notes (Signed)
Patient transported to CT 

## 2016-04-05 NOTE — ED Provider Notes (Signed)
[facility]-EMERGENCY DEPT Provider Note   CSN: [id number] Arrival date & time: 04/04/16  2322  By signing my name below, I, Nelwyn Salisbury, attest that this documentation has been prepared under the direction and in the presence of Linwood Dibbles, MD . Electronically Signed: Nelwyn Salisbury, Scribe. 04/05/2016. 3:37 AM.  History   Chief Complaint Chief Complaint  Patient presents with  . Headache  . Chest Pain   The history is provided by the patient. No language interpreter was used.    HPI Comments:  Danny Mcdowell is a 51 y.o. male with pmhx of HTN who presents to the Emergency Department complaining of sudden-onset, intermittent chest pain which began 15 hours ago. He describes his chest pain as a sharp but aching pain. Pt reports associated nausea, vomiting, and a headache that began shortly after onset of symptoms. No modifying factors indicated. He denies any SOB, neck stiffness, numbness or weakness.  Past Medical History:  Diagnosis Date  . Anxiety   . Arthritis   . Asthma   . Back pain   . Chronic mental illness   . Depression   . GERD (gastroesophageal reflux disease)   . Hyperlipemia   . Hypertension   . Migraines   . Neuromuscular disorder (HCC)    numbness lt leg  . Schizo-affective type schizophrenia, subchronic state (HCC)   . Seasonal allergies   . Sleep apnea    uses a cpap    Patient Active Problem List   Diagnosis Date Noted  . Plantar fasciitis, left 11/29/2015  . GAD (generalized anxiety disorder) 07/26/2015  . Hyperlipidemia 07/26/2015  . OSA (obstructive sleep apnea) 07/26/2015  . Essential hypertension 07/09/2015  . Aortic atherosclerosis (HCC) 07/09/2015    Past Surgical History:  Procedure Laterality Date  . BACK SURGERY  2000   lumb disk  . CARPAL TUNNEL RELEASE     right and left  . KNEE ARTHROSCOPY  1987   left  . NOSE SURGERY  2015  . SHOULDER ARTHROSCOPY W/ ROTATOR CUFF REPAIR     right x2  . SHOULDER ARTHROSCOPY W/ ROTATOR CUFF  REPAIR  2010   left  . SHOULDER ARTHROSCOPY WITH ROTATOR CUFF REPAIR  01/12/2012   Procedure: SHOULDER ARTHROSCOPY WITH ROTATOR CUFF REPAIR;  Surgeon: Mable Paris, MD;  Location: [facility];  Service: Orthopedics;  Laterality: Right;  RIGHT SHOULDER ARTHROSCOPIC RIGHT ROTATOR REPAIR DEBRIDEMENT  AND SUBACROMIAL DECOMPRESSION        Home Medications    Prior to Admission medications   Medication Sig Start Date End Date Taking? Authorizing Provider  albuterol (VENTOLIN HFA) 108 (90 Base) MCG/ACT inhaler Inhale 1-2 puffs into the lungs every 6 (six) hours as needed for wheezing or shortness of breath. 09/12/15   Waldon Merl, PA-C  atorvastatin (LIPITOR) 20 MG tablet TAKE 1 TABLET BY MOUTH DAILY AT 6 PM 10/29/15   Waldon Merl, PA-C  baclofen (LIORESAL) 10 MG tablet Take 1 tablet (10 mg total) by mouth 3 (three) times daily. 07/31/15   Waldon Merl, PA-C  bag balm OINT ointment Apply 1 application topically as needed for dry skin.    Historical Provider, MD  clonazePAM (KLONOPIN) 0.5 MG tablet Take 1 tablet (0.5 mg total) by mouth 2 (two) times daily as needed. Reported on 07/09/2015 12/19/15   Waldon Merl, PA-C  clotrimazole-betamethasone (LOTRISONE) cream Apply 1 application topically 2 (two) times daily. 07/31/15   Waldon Merl, PA-C  desoximetasone (TOPICORT) 0.25 % cream  Apply 1 application topically 2 (two) times daily. 11/21/15   Vivi Barrack, DPM  hydrochlorothiazide (MICROZIDE) 12.5 MG capsule TAKE 1 CAPSULE(12.5 MG) BY MOUTH DAILY 12/18/15   Waldon Merl, PA-C  ketoconazole (NIZORAL) 2 % cream Apply 1 application topically daily. 09/18/15   Vivi Barrack, DPM  naproxen (NAPROSYN) 500 MG tablet Take 1 tablet (500 mg total) by mouth 2 (two) times daily. 04/05/16   Linwood Dibbles, MD  omeprazole (PRILOSEC) 20 MG capsule Take 1 capsule (20 mg total) by mouth 2 (two) times daily. Reported on 07/02/2015 07/31/15   Waldon Merl, PA-C    triamcinolone (NASACORT) 55 MCG/ACT AERO nasal inhaler Place 2 sprays into the nose daily. 07/31/15   Waldon Merl, PA-C    Family History Family History  Problem Relation Age of Onset  . Arthritis Mother 26    Deceased  . Diabetes Mother   . Hyperlipidemia    . Heart disease    . Hypertension    . Sudden death    . Healthy Father     living  . Hypertension Maternal Grandfather   . Arthritis Maternal Grandfather   . Hypertension Maternal Grandmother   . Diabetes Maternal Grandmother   . Hypertension Maternal Aunt   . Diabetes    . Lupus    . Arthritis Maternal Aunt   . Cancer Maternal Aunt     Deceased  . Heart disease Maternal Aunt     x2  . Mental illness Other     Maternal aunt  . Healthy Brother     x2  . Other Sister     Chronic?  . Mental illness Son     x2  . Healthy Daughter     x1  . Healthy Son     x2    Social History Social History  Substance Use Topics  . Smoking status: Former Smoker    Packs/day: 1.00    Quit date: 07/09/2013  . Smokeless tobacco: Never Used  . Alcohol use No     Comment: not since 2010     Allergies   Patient has no known allergies.   Review of Systems Review of Systems  Respiratory: Negative for shortness of breath.   Cardiovascular: Positive for chest pain.  Gastrointestinal: Positive for nausea and vomiting.  Musculoskeletal: Negative for neck stiffness.  Neurological: Positive for headaches. Negative for weakness and numbness.  All other systems reviewed and are negative.    Physical Exam Updated Vital Signs BP 119/73   Pulse (!) 56   Temp 97.5 F (36.4 C) (Oral)   Resp 18   SpO2 100%   Physical Exam  Constitutional: He appears well-developed and well-nourished. No distress.  HENT:  Head: Normocephalic and atraumatic.  Right Ear: External ear normal.  Left Ear: External ear normal.  Eyes: Conjunctivae are normal. Right eye exhibits no discharge. Left eye exhibits no discharge. No scleral  icterus.  Photophobia  Neck: Neck supple. No tracheal deviation present.  Cardiovascular: Normal rate, regular rhythm and intact distal pulses.   Pulmonary/Chest: Effort normal and breath sounds normal. No stridor. No respiratory distress. He has no wheezes. He has no rales.  Abdominal: Soft. Bowel sounds are normal. He exhibits no distension. There is no tenderness. There is no rebound and no guarding.  Musculoskeletal: He exhibits no edema or tenderness.  Neurological: He is alert. He has normal strength. No cranial nerve deficit (no facial droop, extraocular movements intact, no slurred speech) or  sensory deficit. He exhibits normal muscle tone. He displays no seizure activity. Coordination normal.  Skin: Skin is warm and dry. No rash noted.  Psychiatric: He has a normal mood and affect.  Nursing note and vitals reviewed.    ED Treatments / Results  DIAGNOSTIC STUDIES:  Oxygen Saturation is 98% on RA, normal by my interpretation.    COORDINATION OF CARE:  3:37 AM Discussed treatment plan with pt at bedside which includes blood work and imaging and pt agreed to plan.  Labs (all labs ordered are listed, but only abnormal results are displayed) Labs Reviewed  BASIC METABOLIC PANEL  CBC  I-STAT TROPOININ, ED    EKG  EKG Interpretation  Date/Time:  Friday April 04 2016 23:31:58 EDT Ventricular Rate:  74 PR Interval:  162 QRS Duration: 84 QT Interval:  382 QTC Calculation: 424 R Axis:   35 Text Interpretation:  Normal sinus rhythm Nonspecific T wave abnormality Abnormal ECG When compared with ECG of 01/12/2012, No significant change was found Confirmed by [facility] FoundationGLICK  MD, DAVID ([id number]) on 04/05/2016 2:28:07 AM       Radiology Dg Chest 2 View  Result Date: 04/05/2016 CLINICAL DATA:  Chest pain EXAM: CHEST  2 VIEW COMPARISON:  None. FINDINGS: The heart size and mediastinal contours are within normal limits. Both lungs are clear. Mild multilevel vertebral body height loss.  IMPRESSION: No active cardiopulmonary disease. Electronically Signed   By: Deatra RobinsonKevin  Herman M.D.   On: 04/05/2016 00:18   Ct Head Wo Contrast  Result Date: 04/05/2016 CLINICAL DATA:  51 year old male with headache. EXAM: CT HEAD WITHOUT CONTRAST TECHNIQUE: Contiguous axial images were obtained from the base of the skull through the vertex without intravenous contrast. COMPARISON:  CT dated 10/09/2011 FINDINGS: Brain: No evidence of acute infarction, hemorrhage, hydrocephalus, extra-axial collection or mass lesion/mass effect. Vascular: No hyperdense vessel or unexpected calcification. Skull: Normal. Negative for fracture or focal lesion. Sinuses/Orbits: There is mild mucoperiosteal thickening of paranasal sinuses with partial opacification of the ethmoid air cells. No air-fluid levels. Bilateral maxillary sinus retention cysts or polyps noted. The mastoid air cells are clear. Other: None IMPRESSION: 1. No acute intracranial pathology. 2. Mild paranasal sinus disease. Electronically Signed   By: Elgie CollardArash  Radparvar M.D.   On: 04/05/2016 04:04    Procedures Procedures (including critical care time)  Medications Ordered in ED Medications  prochlorperazine (COMPAZINE) injection 10 mg (10 mg Intravenous Given 04/05/16 0403)  diphenhydrAMINE (BENADRYL) injection 12.5 mg (12.5 mg Intravenous Given 04/05/16 0400)     Initial Impression / Assessment and Plan / ED Course  I have reviewed the triage vital signs and the nursing notes.  Pertinent labs & imaging results that were available during my care of the patient were reviewed by me and considered in my medical decision making (see chart for details).   CP atypical for heart disease.  Intermittent sharp pain.  Doubt ACS, PE, dissection. Pt denies history of headaches however migraine listed in his electronic medical records.  Tx with migraine cocktail.  Sx improved.   Stable for discharge.  Final Clinical Impressions(s) / ED Diagnoses   Final diagnoses:    Other migraine without status migrainosus, not intractable    New Prescriptions New Prescriptions   NAPROXEN (NAPROSYN) 500 MG TABLET    Take 1 tablet (500 mg total) by mouth 2 (two) times daily.  I personally performed the services described in this documentation, which was scribed in my presence.  The recorded information has been reviewed and  is accurate.      Linwood Dibbles, MD 04/05/16 [id number]

## 2016-04-05 NOTE — ED Notes (Signed)
Patient states his head has been hurting, sometimes his chest is hurting, his legs hurt, etc.  Patient with numerous complaints. Patient was sleeping in the waiting room when patient was called to come back to the room.  Covering eyes with blanket.

## 2016-04-05 NOTE — ED Notes (Signed)
Patient laying in bed with SO laying beside him.  Leads pulled off as well and pulse ox.  Reapplied all and SO got up into the chair.

## 2016-04-05 NOTE — ED Notes (Signed)
Discharge instructions and prescription reviewed - voiced understanding.  

## 2017-07-15 ENCOUNTER — Other Ambulatory Visit: Payer: Self-pay | Admitting: Orthopedic Surgery

## 2017-07-29 ENCOUNTER — Other Ambulatory Visit: Payer: Self-pay | Admitting: Orthopedic Surgery

## 2017-08-11 NOTE — Pre-Procedure Instructions (Signed)
Danny Mcdowell  08/11/2017      [facility] [address] - [place], [place] - [address] [place] [address] Phone: [phone] Fax: [phone]    Your procedure is scheduled on Thurs., Aug. 1, 2019 from 7:30AM-9:30AM  [facility] "A" at 5:30AM  Call this number if you have problems the morning of surgery:  [phone]   Remember:  Do not eat or drink after midnight on July 31st    Take these medicines the morning of surgery with A SIP OF WATER: Omeprazole (PRILOSEC) If needed HYDROcodone-acetaminophen (NORCO/VICODIN) and Albuterol Inhaler (Bring with you the day of surgery)  7 days before surgery (7/25), stop taking all Other Aspirin Products, Vitamins, Fish oils, and Herbal medications. Also stop all NSAIDS i.e. Advil, Ibuprofen, Motrin, Aleve, Anaprox, Naproxen, BC, Goody Powders, and all Supplements.     Do not wear jewelry.  Do not wear lotions, powders, colognes, or deodorant.  Do not shave 48 hours prior to surgery.  Men may shave face.  Do not bring valuables to the hospital.  [facility] is not responsible for any belongings or valuables.  Contacts, dentures or bridgework may not be worn into surgery.  Leave your suitcase in the car.  After surgery it may be brought to your room.  For patients admitted to the hospital, discharge time will be determined by your treatment team.  Patients discharged the day of surgery will not be allowed to drive home.   Special instructions:   [place]- Preparing For Surgery  Before surgery, you can play an important role. Because skin is not sterile, your skin needs to be as free of germs as possible. You can reduce the number of germs on your skin by washing with CHG (chlorahexidine gluconate) Soap before surgery.  CHG is an antiseptic cleaner which kills germs and bonds with the skin to continue killing germs even after washing.    Oral Hygiene is also important to  reduce your risk of infection.  Remember - BRUSH YOUR TEETH THE MORNING OF SURGERY WITH YOUR REGULAR TOOTHPASTE  Please do not use if you have an allergy to CHG or antibacterial soaps. If your skin becomes reddened/irritated stop using the CHG.  Do not shave (including legs and underarms) for at least 48 hours prior to first CHG shower. It is OK to shave your face.  Please follow these instructions carefully.   1. Shower the NIGHT BEFORE SURGERY and the MORNING OF SURGERY with CHG.   2. If you chose to wash your hair, wash your hair first as usual with your normal shampoo.  3. After you shampoo, rinse your hair and body thoroughly to remove the shampoo.  4. Use CHG as you would any other liquid soap. You can apply CHG directly to the skin and wash gently with a scrungie or a clean washcloth.   5. Apply the CHG Soap to your body ONLY FROM THE NECK DOWN.  Do not use on open wounds or open sores. Avoid contact with your eyes, ears, mouth and genitals (private parts). Wash Face and genitals (private parts)  with your normal soap.  6. Wash thoroughly, paying special attention to the area where your surgery will be performed.  7. Thoroughly rinse your body with warm water from the neck down.  8. DO NOT shower/wash with your normal soap after using and rinsing off the CHG Soap.  9. Pat yourself dry with a CLEAN  TOWEL.  10. Wear CLEAN PAJAMAS to bed the night before surgery, wear comfortable clothes the morning of surgery  11. Place CLEAN SHEETS on your bed the night of your first shower and DO NOT SLEEP WITH PETS.  Day of Surgery:  Do not apply any deodorants/lotions.  Please wear clean clothes to the hospital/surgery center.   Remember to brush your teeth WITH YOUR REGULAR TOOTHPASTE.  Please read over the following fact sheets that you were given. Pain Booklet, Coughing and Deep Breathing, MRSA Information and Surgical Site Infection Prevention

## 2017-08-12 ENCOUNTER — Encounter (HOSPITAL_COMMUNITY)
Admission: RE | Admit: 2017-08-12 | Discharge: 2017-08-12 | Disposition: A | Discharge status: Home / Self Care | Payer: Medicare HMO | Source: Ambulatory Visit | Attending: Orthopedic Surgery | Admitting: Orthopedic Surgery

## 2017-08-12 ENCOUNTER — Other Ambulatory Visit: Payer: Self-pay

## 2017-08-12 ENCOUNTER — Encounter (HOSPITAL_COMMUNITY): Payer: Self-pay

## 2017-08-12 DIAGNOSIS — R001 Bradycardia, unspecified: Secondary | ICD-10-CM | POA: Diagnosis not present

## 2017-08-12 DIAGNOSIS — Z01818 Encounter for other preprocedural examination: Secondary | ICD-10-CM | POA: Diagnosis present

## 2017-08-12 DIAGNOSIS — Z01810 Encounter for preprocedural cardiovascular examination: Secondary | ICD-10-CM | POA: Diagnosis present

## 2017-08-12 DIAGNOSIS — Z01812 Encounter for preprocedural laboratory examination: Secondary | ICD-10-CM | POA: Insufficient documentation

## 2017-08-12 HISTORY — DX: Primary osteoarthritis, right shoulder: M19.011

## 2017-08-12 LAB — CBC WITH DIFFERENTIAL/PLATELET
Abs Immature Granulocytes: 0 10*3/uL (ref 0.0–0.1)
BASOS ABS: 0 10*3/uL (ref 0.0–0.1)
BASOS PCT: 1 %
EOS PCT: 2 %
Eosinophils Absolute: 0.1 10*3/uL (ref 0.0–0.7)
HCT: 45.2 % (ref 39.0–52.0)
Hemoglobin: 14.7 g/dL (ref 13.0–17.0)
Immature Granulocytes: 0 %
Lymphocytes Relative: 36 %
Lymphs Abs: 2.4 10*3/uL (ref 0.7–4.0)
MCH: 30 pg (ref 26.0–34.0)
MCHC: 32.5 g/dL (ref 30.0–36.0)
MCV: 92.2 fL (ref 78.0–100.0)
MONO ABS: 0.4 10*3/uL (ref 0.1–1.0)
Monocytes Relative: 6 %
Neutro Abs: 3.7 10*3/uL (ref 1.7–7.7)
Neutrophils Relative %: 55 %
PLATELETS: 263 10*3/uL (ref 150–400)
RBC: 4.9 MIL/uL (ref 4.22–5.81)
RDW: 12.7 % (ref 11.5–15.5)
WBC: 6.6 10*3/uL (ref 4.0–10.5)

## 2017-08-12 LAB — TYPE AND SCREEN
ABO/RH(D): A POS
ANTIBODY SCREEN: NEGATIVE

## 2017-08-12 LAB — URINALYSIS, ROUTINE W REFLEX MICROSCOPIC
BILIRUBIN URINE: NEGATIVE
Bacteria, UA: NONE SEEN
GLUCOSE, UA: NEGATIVE mg/dL
HGB URINE DIPSTICK: NEGATIVE
KETONES UR: NEGATIVE mg/dL
Leukocytes, UA: NEGATIVE
NITRITE: NEGATIVE
PROTEIN: NEGATIVE mg/dL
SPECIFIC GRAVITY, URINE: 1.019 (ref 1.005–1.030)
pH: 6 (ref 5.0–8.0)

## 2017-08-12 LAB — ABO/RH: ABO/RH(D): A POS

## 2017-08-12 LAB — SURGICAL PCR SCREEN
MRSA, PCR: NEGATIVE
STAPHYLOCOCCUS AUREUS: POSITIVE — AB

## 2017-08-12 LAB — COMPREHENSIVE METABOLIC PANEL
ALT: 21 U/L (ref 0–44)
AST: 20 U/L (ref 15–41)
Albumin: 3.9 g/dL (ref 3.5–5.0)
Alkaline Phosphatase: 40 U/L (ref 38–126)
Anion gap: 9 (ref 5–15)
BILIRUBIN TOTAL: 0.6 mg/dL (ref 0.3–1.2)
BUN: 11 mg/dL (ref 6–20)
CHLORIDE: 108 mmol/L (ref 98–111)
CO2: 22 mmol/L (ref 22–32)
Calcium: 9.3 mg/dL (ref 8.9–10.3)
Creatinine, Ser: 0.81 mg/dL (ref 0.61–1.24)
GFR calc Af Amer: >60 mL/min (ref >60)
GFR calc non Af Amer: >60 mL/min (ref >60)
Glucose, Bld: 110 mg/dL — ABNORMAL HIGH (ref 70–99)
POTASSIUM: 4.5 mmol/L (ref 3.5–5.1)
Sodium: 139 mmol/L (ref 135–145)
Total Protein: 7.4 g/dL (ref 6.5–8.1)

## 2017-08-12 LAB — PROTIME-INR
INR: 0.97
Prothrombin Time: 12.8 seconds (ref 11.4–15.2)

## 2017-08-12 LAB — GLUCOSE, CAPILLARY: Glucose-Capillary: 100 mg/dL — ABNORMAL HIGH (ref 70–99)

## 2017-08-12 LAB — APTT: APTT: 29 s (ref 24–36)

## 2017-08-12 NOTE — Progress Notes (Signed)
Pt made aware of pcr screen result positive for Staph. [facility] to [facility].

## 2017-08-12 NOTE — Progress Notes (Signed)
PCP - Dr. Jamelle HaringSnow  Cardiologist - Denies  Chest x-ray - 08/12/17  EKG - 08/12/17  Stress Test - Denies  ECHO - Denies  Cardiac Cath - Denies  Sleep Study -Yes- Positive CPAP - None  LABS- 08/12/17: CBC w/D, CMP, PT, PTT, T/S UA  ASA- Denies   Anesthesia- No  Pt denies having chest pain, sob, or fever at this time. All instructions explained to the pt, with a verbal understanding of the material. Pt agrees to go over the instructions while at home for a better understanding. The opportunity to ask questions was provided.

## 2017-08-19 ENCOUNTER — Encounter (HOSPITAL_COMMUNITY): Payer: Self-pay | Admitting: Anesthesiology

## 2017-08-19 MED ORDER — CEFAZOLIN SODIUM 10 G IJ SOLR
3.0000 g | INTRAMUSCULAR | Status: AC
  Administered 2017-08-20: 3 g via INTRAVENOUS
  Filled 2017-08-19: qty 3

## 2017-08-19 MED ORDER — TRANEXAMIC ACID 1000 MG/10ML IV SOLN
1000.0000 mg | INTRAVENOUS | Status: AC
  Administered 2017-08-20: 1000 mg via INTRAVENOUS
  Filled 2017-08-19: qty 1100

## 2017-08-19 NOTE — Anesthesia Preprocedure Evaluation (Addendum)
Anesthesia Evaluation  Patient identified by MRN, date of birth, ID band Patient awake    Reviewed: Allergy & Precautions, NPO status , Patient's Chart, lab work & pertinent test results  Airway Mallampati: I  TM Distance: >3 FB Neck ROM: Full    Dental   Pulmonary asthma , sleep apnea and Continuous Positive Airway Pressure Ventilation , Current Smoker,    Pulmonary exam normal        Cardiovascular hypertension, Pt. on medications + Peripheral Vascular Disease  Normal cardiovascular exam     Neuro/Psych  Headaches, PSYCHIATRIC DISORDERS Anxiety Depression Schizophrenia Schizoaffective disorderNumbness left leg  Neuromuscular disease    GI/Hepatic Neg liver ROS, GERD  Medicated and Controlled,  Endo/Other  Morbid obesityHyperlipidemia  Renal/GU negative Renal ROS  negative genitourinary   Musculoskeletal  (+) Arthritis , Osteoarthritis,  Chronic back pain Right shoulder OA   Abdominal   Peds  Hematology negative hematology ROS (+)   Anesthesia Other Findings   Reproductive/Obstetrics                           Anesthesia Physical Anesthesia Plan  ASA: III  Anesthesia Plan: General   Post-op Pain Management:  Regional for Post-op pain   Induction: Intravenous  PONV Risk Score and Plan: 2 and Ondansetron, Midazolam, Dexamethasone and Treatment may vary due to age or medical condition  Airway Management Planned: Oral ETT  Additional Equipment:   Intra-op Plan:   Post-operative Plan: Extubation in OR  Informed Consent: I have reviewed the patients History and Physical, chart, labs and discussed the procedure including the risks, benefits and alternatives for the proposed anesthesia with the patient or authorized representative who has indicated his/her understanding and acceptance.     Plan Discussed with: CRNA and Surgeon  Anesthesia Plan Comments:        Anesthesia Quick  Evaluation

## 2017-08-20 ENCOUNTER — Inpatient Hospital Stay (HOSPITAL_COMMUNITY)
Admission: RE | Admit: 2017-08-20 | Discharge: 2017-08-21 | DRG: 483 | Disposition: A | Discharge status: Home / Self Care | Payer: Medicare HMO | Source: Ambulatory Visit | Attending: Orthopedic Surgery | Admitting: Orthopedic Surgery

## 2017-08-20 ENCOUNTER — Inpatient Hospital Stay (HOSPITAL_COMMUNITY): Payer: Medicare HMO | Admitting: Anesthesiology

## 2017-08-20 ENCOUNTER — Encounter (HOSPITAL_COMMUNITY): Payer: Self-pay | Admitting: *Deleted

## 2017-08-20 ENCOUNTER — Inpatient Hospital Stay (HOSPITAL_COMMUNITY): Payer: Medicare HMO

## 2017-08-20 ENCOUNTER — Encounter (HOSPITAL_COMMUNITY)
Admission: RE | Disposition: A | Discharge status: Home / Self Care | Payer: Self-pay | Source: Ambulatory Visit | Attending: Orthopedic Surgery

## 2017-08-20 ENCOUNTER — Other Ambulatory Visit: Payer: Self-pay

## 2017-08-20 DIAGNOSIS — J45909 Unspecified asthma, uncomplicated: Secondary | ICD-10-CM | POA: Diagnosis present

## 2017-08-20 DIAGNOSIS — I10 Essential (primary) hypertension: Secondary | ICD-10-CM | POA: Diagnosis present

## 2017-08-20 DIAGNOSIS — E785 Hyperlipidemia, unspecified: Secondary | ICD-10-CM | POA: Diagnosis present

## 2017-08-20 DIAGNOSIS — K219 Gastro-esophageal reflux disease without esophagitis: Secondary | ICD-10-CM | POA: Diagnosis present

## 2017-08-20 DIAGNOSIS — M19011 Primary osteoarthritis, right shoulder: Secondary | ICD-10-CM | POA: Diagnosis present

## 2017-08-20 DIAGNOSIS — Z833 Family history of diabetes mellitus: Secondary | ICD-10-CM

## 2017-08-20 DIAGNOSIS — F17210 Nicotine dependence, cigarettes, uncomplicated: Secondary | ICD-10-CM | POA: Diagnosis present

## 2017-08-20 DIAGNOSIS — M75101 Unspecified rotator cuff tear or rupture of right shoulder, not specified as traumatic: Secondary | ICD-10-CM | POA: Diagnosis present

## 2017-08-20 DIAGNOSIS — Z96611 Presence of right artificial shoulder joint: Secondary | ICD-10-CM

## 2017-08-20 DIAGNOSIS — Z8261 Family history of arthritis: Secondary | ICD-10-CM | POA: Diagnosis not present

## 2017-08-20 HISTORY — PX: SHOULDER HEMI-ARTHROPLASTY: SHX5049

## 2017-08-20 SURGERY — HEMIARTHROPLASTY, SHOULDER
Anesthesia: Regional | Site: Shoulder | Laterality: Right

## 2017-08-20 MED ORDER — SODIUM CHLORIDE 0.9 % IV SOLN
INTRAVENOUS | Status: AC
  Administered 2017-08-20: 14:00:00 via INTRAVENOUS

## 2017-08-20 MED ORDER — DEXAMETHASONE SODIUM PHOSPHATE 10 MG/ML IJ SOLN
INTRAMUSCULAR | Status: DC | PRN
  Administered 2017-08-20: 10 mg via INTRAVENOUS

## 2017-08-20 MED ORDER — MEPERIDINE HCL 50 MG/ML IJ SOLN: 6.2500 mg | INTRAMUSCULAR | Status: DC | PRN

## 2017-08-20 MED ORDER — DIPHENHYDRAMINE HCL 12.5 MG/5ML PO ELIX: 12.5000 mg | ORAL_SOLUTION | ORAL | Status: DC | PRN

## 2017-08-20 MED ORDER — BUPIVACAINE LIPOSOME 1.3 % IJ SUSP
INTRAMUSCULAR | Status: DC | PRN
  Administered 2017-08-20: 10 mL via PERINEURAL

## 2017-08-20 MED ORDER — METOCLOPRAMIDE HCL 5 MG/ML IJ SOLN
5.0000 mg | Freq: Three times a day (TID) | INTRAMUSCULAR | Status: DC | PRN
  Administered 2017-08-20: 10 mg via INTRAVENOUS
  Filled 2017-08-20: qty 2

## 2017-08-20 MED ORDER — HYDROMORPHONE HCL 1 MG/ML IJ SOLN
0.2500 mg | INTRAMUSCULAR | Status: DC | PRN
  Administered 2017-08-20 (×2): 0.5 mg via INTRAVENOUS

## 2017-08-20 MED ORDER — METHOCARBAMOL 1000 MG/10ML IJ SOLN
500.0000 mg | Freq: Four times a day (QID) | INTRAMUSCULAR | Status: DC | PRN
  Filled 2017-08-20: qty 5

## 2017-08-20 MED ORDER — ONDANSETRON HCL 4 MG/2ML IJ SOLN
INTRAMUSCULAR | Status: AC
  Filled 2017-08-20: qty 2

## 2017-08-20 MED ORDER — LACTATED RINGERS IV SOLN
INTRAVENOUS | Status: DC | PRN
  Administered 2017-08-20: 07:00:00 via INTRAVENOUS

## 2017-08-20 MED ORDER — ATORVASTATIN CALCIUM 20 MG PO TABS
20.0000 mg | ORAL_TABLET | Freq: Every day | ORAL | Status: DC
  Administered 2017-08-20: 20 mg via ORAL
  Filled 2017-08-20: qty 1

## 2017-08-20 MED ORDER — POVIDONE-IODINE 7.5 % EX SOLN
Freq: Once | CUTANEOUS | Status: DC
  Filled 2017-08-20: qty 118

## 2017-08-20 MED ORDER — ACETAMINOPHEN 500 MG PO TABS
1000.0000 mg | ORAL_TABLET | Freq: Four times a day (QID) | ORAL | Status: AC
  Administered 2017-08-20 – 2017-08-21 (×3): 1000 mg via ORAL
  Filled 2017-08-20 (×3): qty 2

## 2017-08-20 MED ORDER — FENTANYL CITRATE (PF) 250 MCG/5ML IJ SOLN
INTRAMUSCULAR | Status: AC
  Filled 2017-08-20: qty 5

## 2017-08-20 MED ORDER — HYDROMORPHONE HCL 1 MG/ML IJ SOLN
INTRAMUSCULAR | Status: AC
  Filled 2017-08-20: qty 1

## 2017-08-20 MED ORDER — PHENYLEPHRINE HCL 10 MG/ML IJ SOLN
INTRAMUSCULAR | Status: DC | PRN
  Administered 2017-08-20: 25 ug/min via INTRAVENOUS

## 2017-08-20 MED ORDER — DEXAMETHASONE SODIUM PHOSPHATE 10 MG/ML IJ SOLN
INTRAMUSCULAR | Status: AC
  Filled 2017-08-20: qty 1

## 2017-08-20 MED ORDER — ALUMINUM HYDROXIDE GEL 320 MG/5ML PO SUSP
15.0000 mL | ORAL | Status: DC | PRN
  Filled 2017-08-20: qty 30

## 2017-08-20 MED ORDER — SODIUM CHLORIDE 0.9 % IR SOLN
Status: DC | PRN
  Administered 2017-08-20: 3000 mL

## 2017-08-20 MED ORDER — ROCURONIUM BROMIDE 100 MG/10ML IV SOLN
INTRAVENOUS | Status: DC | PRN
  Administered 2017-08-20: 50 mg via INTRAVENOUS

## 2017-08-20 MED ORDER — PROPOFOL 10 MG/ML IV BOLUS
INTRAVENOUS | Status: DC | PRN
  Administered 2017-08-20: 140 mg via INTRAVENOUS

## 2017-08-20 MED ORDER — ONDANSETRON HCL 4 MG/2ML IJ SOLN
4.0000 mg | Freq: Four times a day (QID) | INTRAMUSCULAR | Status: DC | PRN
  Administered 2017-08-20: 4 mg via INTRAVENOUS
  Filled 2017-08-20: qty 2

## 2017-08-20 MED ORDER — HYDROMORPHONE HCL 1 MG/ML IJ SOLN
0.5000 mg | INTRAMUSCULAR | Status: DC | PRN
  Administered 2017-08-20: 1 mg via INTRAVENOUS
  Filled 2017-08-20: qty 1

## 2017-08-20 MED ORDER — BUPIVACAINE-EPINEPHRINE (PF) 0.5% -1:200000 IJ SOLN
INTRAMUSCULAR | Status: DC | PRN
  Administered 2017-08-20: 20 mL via PERINEURAL

## 2017-08-20 MED ORDER — LISINOPRIL 5 MG PO TABS
5.0000 mg | ORAL_TABLET | Freq: Every day | ORAL | Status: DC
  Administered 2017-08-20 – 2017-08-21 (×2): 5 mg via ORAL
  Filled 2017-08-20 (×2): qty 1

## 2017-08-20 MED ORDER — ASPIRIN EC 81 MG PO TBEC
81.0000 mg | DELAYED_RELEASE_TABLET | Freq: Two times a day (BID) | ORAL | Status: DC
  Administered 2017-08-20 – 2017-08-21 (×3): 81 mg via ORAL
  Filled 2017-08-20 (×3): qty 1

## 2017-08-20 MED ORDER — ONDANSETRON HCL 4 MG/2ML IJ SOLN: 4.0000 mg | Freq: Once | INTRAMUSCULAR | Status: DC | PRN

## 2017-08-20 MED ORDER — ALBUTEROL SULFATE HFA 108 (90 BASE) MCG/ACT IN AERS: 1.0000 | INHALATION_SPRAY | Freq: Four times a day (QID) | RESPIRATORY_TRACT | Status: DC | PRN

## 2017-08-20 MED ORDER — BISACODYL 5 MG PO TBEC: 5.0000 mg | DELAYED_RELEASE_TABLET | Freq: Every day | ORAL | Status: DC | PRN

## 2017-08-20 MED ORDER — FENTANYL CITRATE (PF) 100 MCG/2ML IJ SOLN
INTRAMUSCULAR | Status: DC | PRN
  Administered 2017-08-20: 100 ug via INTRAVENOUS
  Administered 2017-08-20: 50 ug via INTRAVENOUS
  Administered 2017-08-20: 100 ug via INTRAVENOUS

## 2017-08-20 MED ORDER — SENNOSIDES-DOCUSATE SODIUM 8.6-50 MG PO TABS: 1.0000 | ORAL_TABLET | Freq: Every evening | ORAL | Status: DC | PRN

## 2017-08-20 MED ORDER — METHOCARBAMOL 500 MG PO TABS
500.0000 mg | ORAL_TABLET | Freq: Four times a day (QID) | ORAL | Status: DC | PRN
  Administered 2017-08-20 – 2017-08-21 (×3): 500 mg via ORAL
  Filled 2017-08-20 (×3): qty 1

## 2017-08-20 MED ORDER — DOCUSATE SODIUM 100 MG PO CAPS
100.0000 mg | ORAL_CAPSULE | Freq: Two times a day (BID) | ORAL | Status: DC
  Administered 2017-08-20 – 2017-08-21 (×2): 100 mg via ORAL
  Filled 2017-08-20 (×2): qty 1

## 2017-08-20 MED ORDER — PROPOFOL 10 MG/ML IV BOLUS
INTRAVENOUS | Status: AC
  Filled 2017-08-20: qty 20

## 2017-08-20 MED ORDER — METOCLOPRAMIDE HCL 5 MG PO TABS: 5.0000 mg | ORAL_TABLET | Freq: Three times a day (TID) | ORAL | Status: DC | PRN

## 2017-08-20 MED ORDER — SUGAMMADEX SODIUM 200 MG/2ML IV SOLN
INTRAVENOUS | Status: DC | PRN
  Administered 2017-08-20: 200 mg via INTRAVENOUS

## 2017-08-20 MED ORDER — ROCURONIUM BROMIDE 10 MG/ML (PF) SYRINGE
PREFILLED_SYRINGE | INTRAVENOUS | Status: AC
  Filled 2017-08-20: qty 10

## 2017-08-20 MED ORDER — MIDAZOLAM HCL 2 MG/2ML IJ SOLN
INTRAMUSCULAR | Status: AC
  Filled 2017-08-20: qty 2

## 2017-08-20 MED ORDER — FLEET ENEMA 7-19 GM/118ML RE ENEM: 1.0000 | ENEMA | Freq: Once | RECTAL | Status: DC | PRN

## 2017-08-20 MED ORDER — EPHEDRINE 5 MG/ML INJ
INTRAVENOUS | Status: AC
  Filled 2017-08-20: qty 10

## 2017-08-20 MED ORDER — METHOCARBAMOL 500 MG PO TABS: 500.0000 mg | ORAL_TABLET | Freq: Three times a day (TID) | ORAL | 0 refills | Status: AC

## 2017-08-20 MED ORDER — SUCCINYLCHOLINE CHLORIDE 200 MG/10ML IV SOSY
PREFILLED_SYRINGE | INTRAVENOUS | Status: AC
  Filled 2017-08-20: qty 10

## 2017-08-20 MED ORDER — OXYCODONE-ACETAMINOPHEN 5-325 MG PO TABS: 1.0000 | ORAL_TABLET | ORAL | 0 refills | Status: AC | PRN

## 2017-08-20 MED ORDER — SUGAMMADEX SODIUM 200 MG/2ML IV SOLN
INTRAVENOUS | Status: AC
  Filled 2017-08-20: qty 2

## 2017-08-20 MED ORDER — OXYCODONE HCL 5 MG PO TABS
5.0000 mg | ORAL_TABLET | ORAL | Status: DC | PRN
  Administered 2017-08-20: 10 mg via ORAL
  Filled 2017-08-20: qty 2

## 2017-08-20 MED ORDER — MENTHOL 3 MG MT LOZG: 1.0000 | LOZENGE | OROMUCOSAL | Status: DC | PRN

## 2017-08-20 MED ORDER — CEFAZOLIN SODIUM-DEXTROSE 2-4 GM/100ML-% IV SOLN
2.0000 g | Freq: Four times a day (QID) | INTRAVENOUS | Status: AC
  Administered 2017-08-20 – 2017-08-21 (×3): 2 g via INTRAVENOUS
  Filled 2017-08-20 (×3): qty 100

## 2017-08-20 MED ORDER — MIDAZOLAM HCL 5 MG/5ML IJ SOLN
INTRAMUSCULAR | Status: DC | PRN
  Administered 2017-08-20: 2 mg via INTRAVENOUS

## 2017-08-20 MED ORDER — OXYCODONE HCL 5 MG PO TABS
10.0000 mg | ORAL_TABLET | ORAL | Status: DC | PRN
  Administered 2017-08-20: 15 mg via ORAL
  Administered 2017-08-21: 10 mg via ORAL
  Filled 2017-08-20: qty 2
  Filled 2017-08-20: qty 3

## 2017-08-20 MED ORDER — PHENOL 1.4 % MT LIQD: 1.0000 | OROMUCOSAL | Status: DC | PRN

## 2017-08-20 MED ORDER — PHENYLEPHRINE 40 MCG/ML (10ML) SYRINGE FOR IV PUSH (FOR BLOOD PRESSURE SUPPORT)
PREFILLED_SYRINGE | INTRAVENOUS | Status: AC
  Filled 2017-08-20: qty 10

## 2017-08-20 MED ORDER — ACETAMINOPHEN 325 MG PO TABS: 325.0000 mg | ORAL_TABLET | Freq: Four times a day (QID) | ORAL | Status: DC | PRN

## 2017-08-20 MED ORDER — ONDANSETRON HCL 4 MG PO TABS: 4.0000 mg | ORAL_TABLET | Freq: Four times a day (QID) | ORAL | Status: DC | PRN

## 2017-08-20 MED ORDER — PANTOPRAZOLE SODIUM 40 MG PO TBEC
40.0000 mg | DELAYED_RELEASE_TABLET | Freq: Every day | ORAL | Status: DC
  Administered 2017-08-20 – 2017-08-21 (×2): 40 mg via ORAL
  Filled 2017-08-20 (×2): qty 1

## 2017-08-20 MED ORDER — DOCUSATE SODIUM 100 MG PO CAPS: 100.0000 mg | ORAL_CAPSULE | Freq: Three times a day (TID) | ORAL | 0 refills | Status: AC | PRN

## 2017-08-20 MED ORDER — ZOLPIDEM TARTRATE 5 MG PO TABS: 5.0000 mg | ORAL_TABLET | Freq: Every evening | ORAL | Status: DC | PRN

## 2017-08-20 SURGICAL SUPPLY — 67 items
BLADE SAW SGTL 73X25 THK (BLADE) ×1 IMPLANT
BLADE SURG 15 STRL LF DISP TIS (BLADE) ×2 IMPLANT
BLADE SURG 15 STRL SS (BLADE) ×4
BOWL SMART MIX CTS (DISPOSABLE) IMPLANT
CHLORAPREP W/TINT 26ML (MISCELLANEOUS) ×4 IMPLANT
COVER SURGICAL LIGHT HANDLE (MISCELLANEOUS) ×2 IMPLANT
DRAPE IMP U-DRAPE 54X76 (DRAPES) ×4 IMPLANT
DRAPE INCISE IOBAN 66X45 STRL (DRAPES) ×2 IMPLANT
DRAPE ORTHO SPLIT 77X108 STRL (DRAPES) ×4
DRAPE SURG 17X23 STRL (DRAPES) ×2 IMPLANT
DRAPE SURG ORHT 6 SPLT 77X108 (DRAPES) ×2 IMPLANT
DRAPE U-SHAPE 47X51 STRL (DRAPES) ×2 IMPLANT
DRSG MEPILEX BORDER 4X4 (GAUZE/BANDAGES/DRESSINGS) ×1 IMPLANT
DRSG MEPILEX BORDER 4X8 (GAUZE/BANDAGES/DRESSINGS) ×1 IMPLANT
DRSG PAD ABDOMINAL 8X10 ST (GAUZE/BANDAGES/DRESSINGS) IMPLANT
ELECT BLADE 4.0 EZ CLEAN MEGAD (MISCELLANEOUS)
ELECT CAUTERY BLADE 6.4 (BLADE) ×2 IMPLANT
ELECT REM PT RETURN 9FT ADLT (ELECTROSURGICAL) ×2
ELECTRODE BLDE 4.0 EZ CLN MEGD (MISCELLANEOUS) IMPLANT
ELECTRODE REM PT RTRN 9FT ADLT (ELECTROSURGICAL) ×1 IMPLANT
EVACUATOR 1/8 PVC DRAIN (DRAIN) ×1 IMPLANT
GAUZE SPONGE 4X4 12PLY STRL (GAUZE/BANDAGES/DRESSINGS) ×2 IMPLANT
GLOVE BIO SURGEON STRL SZ7 (GLOVE) ×2 IMPLANT
GLOVE BIO SURGEON STRL SZ7.5 (GLOVE) ×2 IMPLANT
GLOVE BIOGEL PI IND STRL 8 (GLOVE) ×1 IMPLANT
GLOVE BIOGEL PI INDICATOR 8 (GLOVE) ×1
GOWN STRL REUS W/ TWL LRG LVL3 (GOWN DISPOSABLE) ×3 IMPLANT
GOWN STRL REUS W/TWL LRG LVL3 (GOWN DISPOSABLE) ×6
HANDPIECE INTERPULSE COAX TIP (DISPOSABLE) ×2
HEAD HUM AEQUALIS 52X19 (Head) ×1 IMPLANT
HEMOSTAT SURGICEL 2X14 (HEMOSTASIS) IMPLANT
HOOD PEEL AWAY FACE SHEILD DIS (HOOD) ×4 IMPLANT
KIT BASIN OR (CUSTOM PROCEDURE TRAY) ×2 IMPLANT
KIT TURNOVER KIT B (KITS) ×2 IMPLANT
MANIFOLD NEPTUNE II (INSTRUMENTS) ×2 IMPLANT
NDL HYPO 25GX1X1/2 BEV (NEEDLE) IMPLANT
NDL MAYO TROCAR (NEEDLE) ×1 IMPLANT
NEEDLE HYPO 25GX1X1/2 BEV (NEEDLE) IMPLANT
NEEDLE MAYO TROCAR (NEEDLE) ×2 IMPLANT
NS IRRIG 1000ML POUR BTL (IV SOLUTION) ×2 IMPLANT
PACK SHOULDER (CUSTOM PROCEDURE TRAY) ×2 IMPLANT
PACK UNIVERSAL I (CUSTOM PROCEDURE TRAY) ×2 IMPLANT
PAD ARMBOARD 7.5X6 YLW CONV (MISCELLANEOUS) ×4 IMPLANT
RETRIEVER SUT HEWSON (MISCELLANEOUS) IMPLANT
SET HNDPC FAN SPRY TIP SCT (DISPOSABLE) ×1 IMPLANT
SLING ARM IMMOBILIZER LRG (SOFTGOODS) ×1 IMPLANT
SLING ARM IMMOBILIZER MED (SOFTGOODS) IMPLANT
SLING ARM XL FOAM STRAP (SOFTGOODS) ×1 IMPLANT
SPONGE LAP 18X18 X RAY DECT (DISPOSABLE) ×2 IMPLANT
STEM HUM AEQUALIS STD SZ1B 66 (Stem) ×1 IMPLANT
STRIP CLOSURE SKIN 1/2X4 (GAUZE/BANDAGES/DRESSINGS) ×2 IMPLANT
SUCTION FRAZIER HANDLE 10FR (MISCELLANEOUS) ×1
SUCTION TUBE FRAZIER 10FR DISP (MISCELLANEOUS) ×1 IMPLANT
SUPPORT WRAP ARM LG (MISCELLANEOUS) ×2 IMPLANT
SUT ETHIBOND NAB CT1 #1 30IN (SUTURE) ×8 IMPLANT
SUT FIBERWIRE #2 38 T-5 BLUE (SUTURE) ×4
SUT MNCRL AB 4-0 PS2 18 (SUTURE) ×2 IMPLANT
SUT VIC AB 0 CTB1 27 (SUTURE) IMPLANT
SUT VIC AB 2-0 CT1 27 (SUTURE) ×4
SUT VIC AB 2-0 CT1 TAPERPNT 27 (SUTURE) ×2 IMPLANT
SUTURE FIBERWR #2 38 T-5 BLUE (SUTURE) ×2 IMPLANT
SYR CONTROL 10ML LL (SYRINGE) IMPLANT
TAPE FIBER 2MM 7IN #2 BLUE (SUTURE) ×6 IMPLANT
TOWEL OR 17X24 6PK STRL BLUE (TOWEL DISPOSABLE) ×2 IMPLANT
TOWEL OR 17X26 10 PK STRL BLUE (TOWEL DISPOSABLE) ×2 IMPLANT
TRAY FOLEY MTR SLVR 16FR STAT (SET/KITS/TRAYS/PACK) IMPLANT
WATER STERILE IRR 1000ML POUR (IV SOLUTION) ×2 IMPLANT

## 2017-08-20 NOTE — Anesthesia Procedure Notes (Signed)
Procedure Name: Intubation Date/Time: 08/20/2017 7:45 AM Performed by: Shirlyn Goltz, CRNA Pre-anesthesia Checklist: Patient identified, Emergency Drugs available, Suction available and Patient being monitored Patient Re-evaluated:Patient Re-evaluated prior to induction Oxygen Delivery Method: Circle system utilized Preoxygenation: Pre-oxygenation with 100% oxygen Induction Type: IV induction Ventilation: Mask ventilation without difficulty and Oral airway inserted - appropriate to patient size Laryngoscope Size: Mac and 4 Grade View: Grade I Tube type: Oral Tube size: 7.5 mm Number of attempts: 1 Airway Equipment and Method: Stylet Placement Confirmation: ETT inserted through vocal cords under direct vision,  positive ETCO2 and breath sounds checked- equal and bilateral Secured at: 22 cm Tube secured with: Tape Dental Injury: Teeth and Oropharynx as per pre-operative assessment

## 2017-08-20 NOTE — Anesthesia Procedure Notes (Signed)
Anesthesia Regional Block: Interscalene brachial plexus block   Pre-Anesthetic Checklist: ,, timeout performed, Correct Patient, Correct Site, Correct Laterality, Correct Procedure, Correct Position, site marked, Risks and benefits discussed,  Surgical consent,  Pre-op evaluation,  At surgeon's request and post-op pain management  Laterality: Right  Prep: chloraprep       Needles:  Injection technique: Single-shot  Needle Type: Echogenic Stimulator Needle     Needle Length: 5cm  Needle Gauge: 21     Additional Needles:   Procedures:, nerve stimulator,,,,,,,   Nerve Stimulator or Paresthesia:  Response: 0.4 mA,   Additional Responses:   Narrative:  Start time: 08/20/2017 7:10 AM End time: 08/20/2017 7:25 AM Injection made incrementally with aspirations every 5 mL.  Performed by: Personally  Anesthesiologist: Arta Brucessey, Nevaeh Korte, MD  Additional Notes: Monitors applied. Patient sedated. Sterile prep and drape,hand hygiene and sterile gloves were used. Relevant anatomy identified.Needle position confirmed.Local anesthetic injected incrementally after negative aspiration. Local anesthetic spread visualized around nerve(s). Vascular puncture avoided. No complications. Image printed for medical record.The patient tolerated the procedure well.

## 2017-08-20 NOTE — H&P (Signed)
Danny Mcdowell is an 52 y.o. male.   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  Pain interferes with sleep and quality of life.  Past Medical History:  Diagnosis Date  . Anxiety   . Arthritis   . Asthma   . Back pain   . Chronic mental illness   . Depression   . GERD (gastroesophageal reflux disease)   . Hyperlipemia   . Hypertension   . Migraines   . Neuromuscular disorder (HCC)    numbness lt leg  . Primary osteoarthritis of right shoulder   . Schizo-affective type schizophrenia, subchronic state (HCC)   . Seasonal allergies   . Sleep apnea    uses a cpap    Past Surgical History:  Procedure Laterality Date  . BACK SURGERY  2000   lumb disk  . CARPAL TUNNEL RELEASE     right and left  . KNEE ARTHROSCOPY  1987   left  . NOSE SURGERY  2015  . SHOULDER ARTHROSCOPY W/ ROTATOR CUFF REPAIR     right x2  . SHOULDER ARTHROSCOPY W/ ROTATOR CUFF REPAIR  2010   left  . SHOULDER ARTHROSCOPY WITH ROTATOR CUFF REPAIR  01/12/2012   Procedure: SHOULDER ARTHROSCOPY WITH ROTATOR CUFF REPAIR;  Surgeon: Mable Paris, MD;  Location: [facility];  Service: Orthopedics;  Laterality: Right;  RIGHT SHOULDER ARTHROSCOPIC RIGHT ROTATOR REPAIR DEBRIDEMENT  AND SUBACROMIAL DECOMPRESSION     Family History  Problem Relation Age of Onset  . Arthritis Mother 38       Deceased  . Diabetes Mother   . Hyperlipidemia Unknown   . Heart disease Unknown   . Hypertension Unknown   . Sudden death Unknown   . Healthy Father        living  . Hypertension Maternal Grandfather   . Arthritis Maternal Grandfather   . Hypertension Maternal Grandmother   . Diabetes Maternal Grandmother   . Hypertension Maternal Aunt   . Diabetes Unknown   . Lupus Unknown   . Arthritis Maternal Aunt   . Cancer Maternal Aunt        Deceased  . Heart disease Maternal Aunt         x2  . Mental illness Other        Maternal aunt  . Healthy Brother        x2  . Other Sister        Chronic?  . Mental illness Son        x2  . Healthy Daughter        x1  . Healthy Son        x2   Social History:  reports that he has been smoking cigarettes.  He has been smoking about 0.25 packs per day. He has never used smokeless tobacco. He reports that he drinks alcohol. He reports that he has current or past drug history. Drug: Marijuana.  Allergies: No Known Allergies  Medications Prior to Admission  Medication Sig Dispense Refill  . atorvastatin (LIPITOR) 20 MG tablet TAKE 1 TABLET BY MOUTH DAILY AT 6 PM (Patient taking differently: Take 20 mg by mouth once daily at bedtime) 30 tablet 2  . bag balm OINT ointment Apply 1 application topically as needed for irritation.    Marland Kitchen HYDROcodone-acetaminophen (NORCO/VICODIN) 5-325 MG tablet Take 2 tablets by mouth 3 (three) times daily as needed for moderate  pain.    . lisinopril (PRINIVIL,ZESTRIL) 5 MG tablet Take 5 mg by mouth daily.    . Multiple Vitamin (MULTIVITAMIN WITH MINERALS) TABS tablet Take 1 tablet by mouth daily.    Marland Kitchen. omeprazole (PRILOSEC) 20 MG capsule Take 1 capsule (20 mg total) by mouth 2 (two) times daily. Reported on 07/02/2015 60 capsule 2  . albuterol (VENTOLIN HFA) 108 (90 Base) MCG/ACT inhaler Inhale 1-2 puffs into the lungs every 6 (six) hours as needed for wheezing or shortness of breath. 1 Inhaler 5  . hydrochlorothiazide (MICROZIDE) 12.5 MG capsule TAKE 1 CAPSULE(12.5 MG) BY MOUTH DAILY (Patient not taking: Reported on 08/10/2017) 30 capsule 0    No results found for this or any previous visit (from the past 48 hour(s)). No results found.  Review of Systems  All other systems reviewed and are negative.   Blood pressure 130/90, pulse (!) 56, temperature 98 F (36.7 C), temperature source Oral, resp. rate 20, weight 119.7 kg (264 lb), SpO2 99 %. Physical Exam  Constitutional: He is oriented to  person, place, and time. He appears well-developed and well-nourished.  HENT:  Head: Atraumatic.  Eyes: EOM are normal.  Cardiovascular: Intact distal pulses.  Respiratory: Effort normal.  Musculoskeletal:  R shoulder pain with limited ROM. NVID  Neurological: He is alert and oriented to person, place, and time.  Skin: Skin is warm and dry.  Psychiatric: He has a normal mood and affect.     Assessment/Plan R shoulder arthritis with irreparable RCT significant pain and dysfunction, failed conservative measures.  Pain interferes with sleep and quality of life. Plan R shoulder hemiarthroplasty Risks / benefits of surgery discussed Consent on chart  NPO for OR Preop antibiotics   Berline LopesJustin W Jerron Niblack, MD 08/20/2017, 7:31 AM

## 2017-08-20 NOTE — Transfer of Care (Signed)
Immediate Anesthesia Transfer of Care Note  Patient: Danny Mcdowell  Procedure(s) Performed: SHOULDER HEMI-ARTHROPLASTY (Right Shoulder)  Patient Location: PACU  Anesthesia Type:General and Regional  Level of Consciousness: drowsy and patient cooperative  Airway & Oxygen Therapy: Patient Spontanous Breathing and Patient connected to nasal cannula oxygen  Post-op Assessment: Report given to RN and Post -op Vital signs reviewed and stable  Post vital signs: Reviewed and stable  Last Vitals:  Vitals Value Taken Time  BP 137/74 08/20/2017  9:50 AM  Temp 36.3 C 08/20/2017  9:50 AM  Pulse 63 08/20/2017  9:52 AM  Resp 16 08/20/2017  9:52 AM  SpO2 97 % 08/20/2017  9:52 AM  Vitals shown include unvalidated device data.  Last Pain:  Vitals:   08/20/17 0950  TempSrc:   PainSc: Asleep         Complications: No apparent anesthesia complications

## 2017-08-20 NOTE — Discharge Instructions (Signed)

## 2017-08-20 NOTE — Plan of Care (Signed)

## 2017-08-20 NOTE — Op Note (Signed)
Procedure(s): SHOULDER HEMI-ARTHROPLASTY Procedure Note  Danny Mcdowell male 52 y.o. 08/20/2017  Procedure(s) and Anesthesia Type:    * RIGHT SHOULDER HEMI-ARTHROPLASTY - General  Surgeon(s) and Role:    Jones Broom, MD - Primary   Indications:  52 y.o. male  With advanced right shoulder arthritis with irreparable rotator cuff tear. Pain and dysfunction interfered with quality of life and nonoperative treatment with activity modification, NSAIDS and injections failed.     Surgeon: Berline Lopes   Assistants: Damita Lack PA-C [facility] was present and scrubbed throughout the procedure and was essential in positioning, retraction, exposure, and closure)  Anesthesia: General endotracheal anesthesia with preoperative interscalene block given by the attending anesthesiologist    Procedure Detail  SHOULDER HEMI-ARTHROPLASTY  Findings: Tornier flex anatomic press-fit size 1 stem with a 52 x 19 head.   A lesser tuberosity osteotomy was performed and repaired at the conclusion of the procedure.  Estimated Blood Loss:  200 mL         Drains: None   Blood Given: none          Specimens: none        Complications:  * No complications entered in OR log *         Disposition: PACU - hemodynamically stable.         Condition: stable    Procedure:   The patient was identified in the preoperative holding area where I personally marked the operative extremity after verifying with the patient and consent. He  was taken to the operating room where He was transferred to the   operative table.  The patient received an interscalene block in   the holding area by the attending anesthesiologist.  General anesthesia was induced   in the operating room without complication.  The patient did receive IV  Ancef prior to the commencement of the procedure.  The patient was   placed in the beach-chair position with the back raised about 30   degrees.  The nonoperative  extremity and head and neck were carefully   positioned and padded protecting against neurovascular compromise.  The   left upper extremity was then prepped and draped in the standard sterile   fashion.    The appropriate operative time-out was performed with   Anesthesia, the perioperative staff, as well as myself and we all agreed   that the right side was the correct operative site.  The patient received 1 g IV tranexamic acid at the start of the case around time of the incision. An approximately   10 cm incision was made from the tip of the coracoid to the center point of the   humerus at the level of the axilla.  Dissection was carried down sharply   through subcutaneous tissues and cephalic vein was identified and taken   laterally with the deltoid.  The pectoralis major was taken medially.  The   upper 1 cm of the pectoralis major was released from its attachment on   the humerus.  The clavipectoral fascia was incised just lateral to the   conjoined tendon.  This incision was carried up to but not into the   coracoacromial ligament.  Digital palpation was used to prove   integrity of the axillary nerve which was protected throughout the   procedure.  Musculocutaneous nerve was not palpated in the operative   field.  Conjoined tendon was then retracted gently medially and the   deltoid laterally.  Anterior  circumflex humeral vessels were clamped and   coagulated.  The soft tissues overlying the biceps was incised and this   incision was carried across the transverse humeral ligament to the base   of the coracoid.  The biceps was noted to be severely degenerated.  And appeared to be previously tenotomized and stuck in the groove.  An osteotomy was performed at the lesser tuberosity.  The capsule was then   released all the way down to the 6 o'clock position of the humeral head.   The humeral head was then delivered with simultaneous adduction,   extension and external rotation.  All  humeral osteophytes were removed   and the anatomic neck of the humerus was marked and cut free hand at   approximately 25 degrees retroversion within about 3 mm of the cuff   reflection posteriorly.  The head size was estimated to be a 52 medium   offset.  At that point, the humeral head was retracted posteriorly with   a Fukuda retractor.  The glenoid was noted to be intact without significant wear.   The proximal humerus was then again exposed taking care not to displace the glenoid.    The entry awl was used followed by sounding reamers and then broached for a size 1 which was noted to be tight.  His bone was quite dense.  This was then left in place and the calcar planer was used. Trial head was placed with a 52 x 19.  With the trial implantation of the component,  there was approximately 50% posterior translation with immediate snap back to the   anatomic position.  With forward elevation, there was no tendency   towards posterior subluxation.   The trial was removed and the final implant was prepared on a back table.  The trial was removed and the final implant was prepared on a back table.   3 small holes were drilled on the medial side of the lesser tuberosity osteotomy, through which 2 labral tapes were passed. The implant was then placed through the loop of the 2 labral tapes and impacted with an excellent press-fit. This achieved excellent anatomic reconstruction of the proximal humerus.  The joint was then copiously irrigated with pulse lavage.  The subscapularis and   lesser tuberosity osteotomy were then repaired using the 2 labral tapes previously passed in a double row fashion with horizontal mattress sutures medially brought over through bone tunnels tied over a bone bridge laterally.   One #1 Ethibond was placed at the rotator interval just above   the lesser tuberosity. Copious irrigation was used. Skin was closed with 2-0 Vicryl sutures in the deep dermal layer and 4-0 Monocryl  in a subcuticular  running fashion.  Sterile dressings were then applied including Aquacel.  The patient was placed in a sling and allowed to awaken from general anesthesia and taken to the recovery room in stable condition.      POSTOPERATIVE PLAN:  Early passive range of motion will be allowed with the goal of 0 degrees external rotation and 90 degrees forward elevation.  No internal rotation at this time.  No active motion of the arm until the lesser tuberosity heals.  The patient will likely be kept in the hospital for 1-2 days and then discharged home.

## 2017-08-20 NOTE — Progress Notes (Signed)
Pt. Originally stated he didn't get a call from pre-admission stating the PCR was positive. Pt. Treated with betadine in each nostril this am. Pt. Then [facility] was [facility] were [facility] received [facility] prescription.

## 2017-08-20 NOTE — Anesthesia Postprocedure Evaluation (Signed)
Anesthesia Post Note  Patient: Latanya MaudlinHarvey J Wheelwright  Procedure(s) Performed: SHOULDER HEMI-ARTHROPLASTY (Right Shoulder)     Patient location during evaluation: PACU Anesthesia Type: Regional Level of consciousness: awake and alert Pain management: pain level controlled Vital Signs Assessment: post-procedure vital signs reviewed and stable Respiratory status: spontaneous breathing, nonlabored ventilation, respiratory function stable and patient connected to nasal cannula oxygen Cardiovascular status: blood pressure returned to baseline and stable Postop Assessment: no apparent nausea or vomiting Anesthetic complications: no    Last Vitals:  Vitals:   08/20/17 1224 08/20/17 1705  BP: 121/77 (!) 119/92  Pulse: (!) 58 65  Resp: 16 16  Temp: 36.9 C 36.5 C  SpO2: 97% 92%    Last Pain:  Vitals:   08/20/17 1716  TempSrc:   PainSc: 10-Worst pain ever                 Trevyon Swor DAVID

## 2017-08-21 LAB — CBC
HCT: 38.1 % — ABNORMAL LOW (ref 39.0–52.0)
Hemoglobin: 12.4 g/dL — ABNORMAL LOW (ref 13.0–17.0)
MCH: 29.9 pg (ref 26.0–34.0)
MCHC: 32.5 g/dL (ref 30.0–36.0)
MCV: 91.8 fL (ref 78.0–100.0)
Platelets: 239 10*3/uL (ref 150–400)
RBC: 4.15 MIL/uL — AB (ref 4.22–5.81)
RDW: 12.9 % (ref 11.5–15.5)
WBC: 12.8 10*3/uL — ABNORMAL HIGH (ref 4.0–10.5)

## 2017-08-21 LAB — BASIC METABOLIC PANEL
ANION GAP: 8 (ref 5–15)
BUN: 15 mg/dL (ref 6–20)
CHLORIDE: 103 mmol/L (ref 98–111)
CO2: 27 mmol/L (ref 22–32)
Calcium: 8.8 mg/dL — ABNORMAL LOW (ref 8.9–10.3)
Creatinine, Ser: 1.04 mg/dL (ref 0.61–1.24)
GFR calc non Af Amer: >60 mL/min (ref >60)
Glucose, Bld: 168 mg/dL — ABNORMAL HIGH (ref 70–99)
POTASSIUM: 4.1 mmol/L (ref 3.5–5.1)
SODIUM: 138 mmol/L (ref 135–145)

## 2017-08-21 NOTE — Discharge Summary (Signed)
Patient ID: Danny Mcdowell MRN: [id number] DOB/AGE: [birth year] 52 y.o.  Admit date: 08/20/2017 Discharge date: 08/21/2017  Admission Diagnoses:  Active Problems:   Status post total shoulder arthroplasty, right   Discharge Diagnoses:  Same  Past Medical History:  Diagnosis Date  . Anxiety   . Arthritis   . Asthma   . Back pain   . Chronic mental illness   . Depression   . GERD (gastroesophageal reflux disease)   . Hyperlipemia   . Hypertension   . Migraines   . Neuromuscular disorder (HCC)    numbness lt leg  . Primary osteoarthritis of right shoulder   . Schizo-affective type schizophrenia, subchronic state (HCC)   . Seasonal allergies   . Sleep apnea    uses a cpap    Surgeries: Procedure(s): SHOULDER HEMI-ARTHROPLASTY on 08/20/2017   Consultants:   Discharged Condition: Improved  Hospital Course: Danny Mcdowell is an 52 y.o. 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 Patient has severe unremitting pain that affects sleep, daily activities, and work/hobbies. After pre-op clearance the patient was taken to the operating room on 08/20/2017 and underwent  Procedure(s): SHOULDER HEMI-ARTHROPLASTY.    Patient was given perioperative antibiotics:  Anti-infectives (From admission, onward)   Start     Dose/Rate Route Frequency Ordered Stop   08/20/17 1330  ceFAZolin (ANCEF) IVPB 2g/100 mL premix     2 g 200 mL/hr over 30 Minutes Intravenous Every 6 hours 08/20/17 1231 08/21/17 0039   08/20/17 0600  ceFAZolin (ANCEF) 3 g in dextrose 5 % 50 mL IVPB     3 g 100 mL/hr over 30 Minutes Intravenous To Short Stay 08/19/17 0821 08/20/17 0746       Patient was given sequential compression devices, early ambulation, and asa to prevent DVT.  Patient benefited maximally from hospital stay and there were no complications.    Recent vital signs:  Patient Vitals for the past 24 hrs:  BP Temp Temp src Pulse  Resp SpO2 Height  08/21/17 0414 (!) 107/47 98.7 F (37.1 C) Oral (!) 59 - 97 % -  08/21/17 0100 104/62 98.6 F (37 C) Oral 77 - 98 % -  08/20/17 2100 103/67 98.3 F (36.8 C) Oral 69 16 99 % -  08/20/17 1752 - - - - - - 5\' 8"  (1.727 m)  08/20/17 1705 (!) 119/92 97.7 F (36.5 C) Oral 65 16 92 % -  08/20/17 1224 121/77 98.4 F (36.9 C) Oral (!) 58 16 97 % -     Recent laboratory studies:  Recent Labs    08/21/17 0411  WBC 12.8*  HGB 12.4*  HCT 38.1*  PLT 239  NA 138  K 4.1  CL 103  CO2 27  BUN 15  CREATININE 1.04  GLUCOSE 168*  CALCIUM 8.8*     Discharge Medications:   Allergies as of 08/21/2017   No Known Allergies     Medication List    STOP taking these medications   HYDROcodone-acetaminophen 5-325 MG tablet Commonly known as:  NORCO/VICODIN     TAKE these medications   albuterol 108 (90 Base) MCG/ACT inhaler Commonly known as:  VENTOLIN HFA Inhale 1-2 puffs into the lungs every 6 (six) hours as needed for wheezing or shortness of breath.   atorvastatin 20 MG tablet Commonly known as:  LIPITOR TAKE 1 TABLET BY MOUTH DAILY AT 6 PM What changed:  See the new instructions.  bag balm Oint ointment Apply 1 application topically as needed for irritation.   docusate sodium 100 MG capsule Commonly known as:  COLACE Take 1 capsule (100 mg total) by mouth 3 (three) times daily as needed.   hydrochlorothiazide 12.5 MG capsule Commonly known as:  MICROZIDE TAKE 1 CAPSULE(12.5 MG) BY MOUTH DAILY   lisinopril 5 MG tablet Commonly known as:  PRINIVIL,ZESTRIL Take 5 mg by mouth daily.   methocarbamol 500 MG tablet Commonly known as:  ROBAXIN Take 1 tablet (500 mg total) by mouth 3 (three) times daily.   multivitamin with minerals Tabs tablet Take 1 tablet by mouth daily.   omeprazole 20 MG capsule Commonly known as:  PRILOSEC Take 1 capsule (20 mg total) by mouth 2 (two) times daily. Reported on 07/02/2015   oxyCODONE-acetaminophen 5-325 MG  tablet Commonly known as:  PERCOCET Take 1-2 tablets by mouth every 4 (four) hours as needed for severe pain.       Diagnostic Studies: Dg Chest 2 View  Result Date: 08/12/2017 CLINICAL DATA:  Preoperative shoulder replacement surgery EXAM: CHEST - 2 VIEW COMPARISON:  April 05, 2016 FINDINGS: Postoperative changes noted in each shoulder. There is evidence of old trauma involving the lateral left clavicle, stable. There is slight atelectasis in the left lower lobe. Lungs elsewhere are clear. Heart size and pulmonary vascularity are normal. No adenopathy. There is degenerative change in the thoracic spine. There is anterior wedging of several lower thoracic vertebral bodies, stable. IMPRESSION: Postoperative change in each shoulder. Old trauma involving distal left clavicle. Anterior wedging of several thoracic vertebral bodies, stable. Slight left lower lobe atelectatic change. Lungs elsewhere clear. Stable cardiac silhouette. Electronically Signed   By: Bretta Bang III M.D.   On: 08/12/2017 13:22   Dg Shoulder Right Port  Result Date: 08/20/2017 CLINICAL DATA:  Post Right SHOULDER HEMI-ARTHROPLASTY For RIGHT SHOULDER PRIMARY OSTEOARTHRITIS. EXAM: PORTABLE RIGHT SHOULDER COMPARISON:  Arthrogram 06/24/2010 FINDINGS: Interval RIGHT shoulder arthroplasty. No interval fracture. No evidence for dislocation. Subacromial narrowing. IMPRESSION: Interval RIGHT shoulder arthroplasty.  No adverse features. Electronically Signed   By: Norva Pavlov M.D.   On: 08/20/2017 10:41    Disposition: Discharge disposition: 01-Home or Self Care       Discharge Instructions    Call MD / Call 911   Complete by:  As directed    If you experience chest pain or shortness of breath, CALL 911 and be transported to the hospital emergency room.  If you develope a fever above 101 F, pus (white drainage) or increased drainage or redness at the wound, or calf pain, call your surgeon's office.   Constipation  Prevention   Complete by:  As directed    Drink plenty of fluids.  Prune juice may be helpful.  You may use a stool softener, such as Colace (over the counter) 100 mg twice a day.  Use MiraLax (over the counter) for constipation as needed.   Diet - low sodium heart healthy   Complete by:  As directed    Increase activity slowly as tolerated   Complete by:  As directed       Follow-up Information    Jones Broom, MD. Schedule an appointment as soon as possible for a visit in 2 weeks.   Specialty:  Orthopedic Surgery Contact information: [address] [place] [address] [phone]            Signed: Jiles Harold 08/21/2017, 12:14 PM
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
## 2017-08-21 NOTE — Plan of Care (Signed)

## 2017-08-21 NOTE — Evaluation (Signed)
Occupational Therapy Evaluation Patient Details Name: Danny Mcdowell MRN: [id number] DOB: Dec 04, 1965 Today's Date: 08/21/2017    History of Present Illness This 52 y.o. male admitted for Rt TSA due to irreparable RTC.   PMH includes:  Schizo-affective type schizophrenia, back pain, migrains, numbness in legs.     Clinical Impression   Patient evaluated by Occupational Therapy with no further acute OT needs identified. All education has been completed and the patient has no further questions. All education completed.  Pt able to perform ADLs at supervision - mod I level.  Pt demonstrates good understanding of shoulder precautions.  See below for any follow-up Occupational Therapy or equipment needs. OT is signing off. Thank you for this referral.      Follow Up Recommendations  Follow surgeon's recommendation for DC plan and follow-up therapies    Equipment Recommendations  None recommended by OT    Recommendations for Other Services       Precautions / Restrictions Precautions Precautions: Shoulder Type of Shoulder Precautions: SlingAt all times except ADL / exercise Precaution Booklet Issued: Yes (comment) Precaution Comments: shoulder handouts provided, and reviewed with pt  Restrictions Weight Bearing Restrictions: Yes RUE Weight Bearing: Non weight bearing      Mobility Bed Mobility               General bed mobility comments: pt up in chair   Transfers Overall transfer level: Independent                    Balance Overall balance assessment: No apparent balance deficits (not formally assessed)                                         ADL either performed or assessed with clinical judgement   ADL Overall ADL's : Needs assistance/impaired Eating/Feeding: Modified independent   Grooming: Wash/dry hands;Wash/dry face;Oral care;Brushing hair;Modified independent;Standing   Upper Body Bathing: Supervision/ safety;Standing    Lower Body Bathing: Supervison/ safety;Sit to/from stand   Upper Body Dressing : Supervision/safety;Standing   Lower Body Dressing: Supervision/safety;Sit to/from stand   Toilet Transfer: Independent;Ambulation;Comfort height toilet   Toileting- Clothing Manipulation and Hygiene: Modified independent;Sit to/from stand       Functional mobility during ADLs: Independent General ADL Comments: supervision to ensure compliance with shoulder precautions      Vision         Perception     Praxis      Pertinent Vitals/Pain Pain Assessment: Faces Faces Pain Scale: Hurts little more Pain Location: Rt shoulder  Pain Descriptors / Indicators: Operative site guarding Pain Intervention(s): Monitored during session;Repositioned     Hand Dominance Right   Extremity/Trunk Assessment Upper Extremity Assessment Upper Extremity Assessment: RUE deficits/detail RUE Deficits / Details: elbow distally WFL    Lower Extremity Assessment Lower Extremity Assessment: Overall WFL for tasks assessed   Cervical / Trunk Assessment Cervical / Trunk Assessment: Normal   Communication Communication Communication: No difficulties   Cognition Arousal/Alertness: Awake/alert Behavior During Therapy: WFL for tasks assessed/performed Overall Cognitive Status: Within Functional Limits for tasks assessed                                     General Comments  reviewed shoulder precautions - reiforced no ROM of shoulder - pt demonstrates good understanding of  precautions     Exercises Exercises: Shoulder Shoulder Exercises Elbow Flexion: AROM;Right;15 reps;Standing Elbow Extension: AROM;Right;15 reps;Standing Wrist Flexion: AROM;Right;10 reps;Standing Wrist Extension: AROM;Right;10 reps;Standing Digit Composite Flexion: AROM;Right;10 reps;Standing Composite Extension: AROM;Right;10 reps;Standing Neck Flexion: AROM;5 reps;Standing Neck Extension: AROM;5 reps;Seated   Shoulder  Instructions Shoulder Instructions Donning/doffing shirt without moving shoulder: Supervision/safety Method for sponge bathing under operated UE: Supervision/safety Donning/doffing sling/immobilizer: Supervision/safety Correct positioning of sling/immobilizer: Independent ROM for elbow, wrist and digits of operated UE: Supervision/safety Sling wearing schedule (on at all times/off for ADL's): Independent Proper positioning of operated UE when showering: Supervision/safety Positioning of UE while sleeping: Supervision/safety    Home Living Family/patient expects to be discharged to:: Private residence Living Arrangements: Children Available Help at Discharge: Available 24 hours/day;Available PRN/intermittently                                    Prior Functioning/Environment Level of Independence: Independent                 OT Problem List: Decreased strength;Decreased range of motion;Decreased knowledge of precautions;Impaired UE functional use;Pain      OT Treatment/Interventions:      OT Goals(Current goals can be found in the care plan section) Acute Rehab OT Goals Patient Stated Goal: "[name] gonna do what I need to do to get this shoulder rehab'd right"  OT Goal Formulation: All assessment and education complete, DC therapy  OT Frequency:     Barriers to D/C:            Co-evaluation              AM-PAC PT "6 Clicks" Daily Activity     Outcome Measure Help from another person eating meals?: None Help from another person taking care of personal grooming?: None Help from another person toileting, which includes using toliet, bedpan, or urinal?: None Help from another person bathing (including washing, rinsing, drying)?: None Help from another person to put on and taking off regular upper body clothing?: None Help from another person to put on and taking off regular lower body clothing?: None 6 Click Score: 24   End of Session Equipment Utilized  During Treatment: Other (comment)(sling )  Activity Tolerance: Patient tolerated treatment well Patient left: Other (comment)(walking in room )  OT Visit Diagnosis: Pain Pain - Right/Left: Right Pain - part of body: Shoulder                Time: 8295-6213 OT Time Calculation (min): 19 min Charges:  OT General Charges $OT Visit: 1 Visit OT Evaluation $OT Eval Low Complexity: 1 Low  Danny Mcdowell, OTR/L [phone]   Danny Mcdowell 08/21/2017, 9:52 AM

## 2017-08-21 NOTE — Plan of Care (Signed)
  Problem: Education: Goal: Knowledge of General Education information will improve Description Including pain rating scale, medication(s)/side effects and non-pharmacologic comfort measures 08/21/2017 1357 by Darreld Mcleanox, Jazzmen Restivo, RN Outcome: Adequate for Discharge 08/21/2017 1045 by Darreld Mcleanox, Wiley Magan, RN Outcome: Progressing   Problem: Clinical Measurements: Goal: Ability to maintain clinical measurements within normal limits will improve 08/21/2017 1357 by Darreld Mcleanox, Ceriah Kohler, RN Outcome: Adequate for Discharge 08/21/2017 1045 by Darreld Mcleanox, Ramiz Turpin, RN Outcome: Progressing Goal: Will remain free from infection 08/21/2017 1357 by Darreld Mcleanox, Hence Derrick, RN Outcome: Adequate for Discharge 08/21/2017 1045 by Darreld Mcleanox, Lennart Gladish, RN Outcome: Progressing Goal: Diagnostic test results will improve 08/21/2017 1357 by Darreld Mcleanox, Aniqua Briere, RN Outcome: Adequate for Discharge 08/21/2017 1045 by Darreld Mcleanox, Kitai Purdom, RN Outcome: Progressing Goal: Respiratory complications will improve 08/21/2017 1357 by Darreld Mcleanox, Galvin Aversa, RN Outcome: Adequate for Discharge 08/21/2017 1045 by Darreld Mcleanox, Ervie Mccard, RN Outcome: Progressing Goal: Cardiovascular complication will be avoided 08/21/2017 1357 by Darreld Mcleanox, Emad Brechtel, RN Outcome: Adequate for Discharge 08/21/2017 1045 by Darreld Mcleanox, Viyaan Champine, RN Outcome: Progressing   Problem: Activity: Goal: Risk for activity intolerance will decrease 08/21/2017 1357 by Darreld Mcleanox, Lagina Reader, RN Outcome: Adequate for Discharge 08/21/2017 1045 by Darreld Mcleanox, Tiawanna Luchsinger, RN Outcome: Progressing   Problem: Nutrition: Goal: Adequate nutrition will be maintained 08/21/2017 1357 by Darreld Mcleanox, Rae Plotner, RN Outcome: Adequate for Discharge 08/21/2017 1045 by Darreld Mcleanox, Safal Halderman, RN Outcome: Progressing   Problem: Coping: Goal: Level of anxiety will decrease 08/21/2017 1357 by Darreld Mcleanox, Dietrich Ke, RN Outcome: Adequate for Discharge 08/21/2017 1045 by Darreld Mcleanox, Ivis Henneman, RN Outcome: Progressing   Problem: Elimination: Goal: Will not experience complications related to bowel motility 08/21/2017 1357 by Darreld Mcleanox, Kaimani Clayson, RN Outcome: Adequate for Discharge 08/21/2017 1045 by Darreld Mcleanox,  Jacie Tristan, RN Outcome: Progressing Goal: Will not experience complications related to urinary retention 08/21/2017 1357 by Darreld Mcleanox, Taven Strite, RN Outcome: Adequate for Discharge 08/21/2017 1045 by Darreld Mcleanox, Rukia Mcgillivray, RN Outcome: Progressing   Problem: Pain Managment: Goal: General experience of comfort will improve 08/21/2017 1357 by Darreld Mcleanox, Amish Mintzer, RN Outcome: Adequate for Discharge 08/21/2017 1045 by Darreld Mcleanox, Alyssamarie Mounsey, RN Outcome: Progressing   Problem: Safety: Goal: Ability to remain free from injury will improve 08/21/2017 1357 by Darreld Mcleanox, Sheleen Conchas, RN Outcome: Adequate for Discharge 08/21/2017 1045 by Darreld Mcleanox, Kien Mirsky, RN Outcome: Progressing   Problem: Skin Integrity: Goal: Risk for impaired skin integrity will decrease 08/21/2017 1357 by Darreld Mcleanox, Riki Gehring, RN Outcome: Adequate for Discharge 08/21/2017 1045 by Darreld Mcleanox, Iann Rodier, RN Outcome: Progressing

## 2017-08-21 NOTE — Progress Notes (Signed)
Patient requests pain medication at this time.  Patient medicated with 10mg  Oxycodone per MD order.  Will continue to monitor

## 2017-08-24 ENCOUNTER — Encounter (HOSPITAL_COMMUNITY): Payer: Self-pay | Admitting: Orthopedic Surgery

## 2018-01-27 IMAGING — CT CT HEAD W/O CM
3 of 4 series · 17 of 47 positions shown, 20 images · non-contrast
Comparison: CT dated 10/09/2011

CLINICAL DATA: 50-year-old male with headache.

EXAM:
CT HEAD WITHOUT CONTRAST
TECHNIQUE: Contiguous axial images were obtained from the base of the skull
through the vertex without intravenous contrast.

[Series 201: head w/o, idose (1) · axial · non-contrast · 0.46mm/px · z∈[+150,+295]mm · 11 of 35 slices shown, 14 images]
[im 3/35  brain]
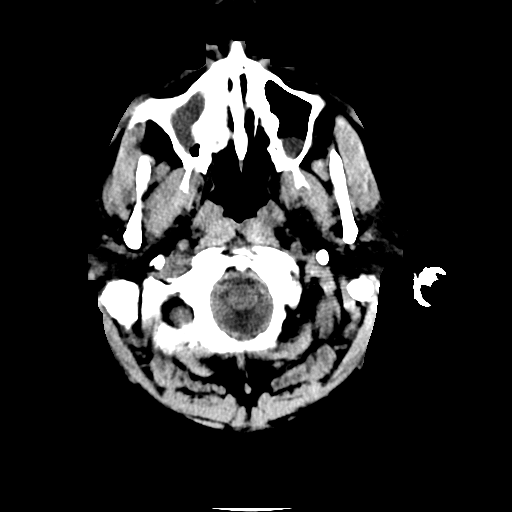
[im 3/35  bone]
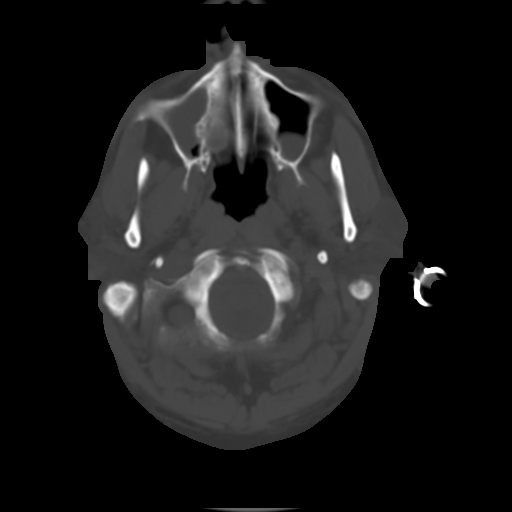
[im 5/35  brain]
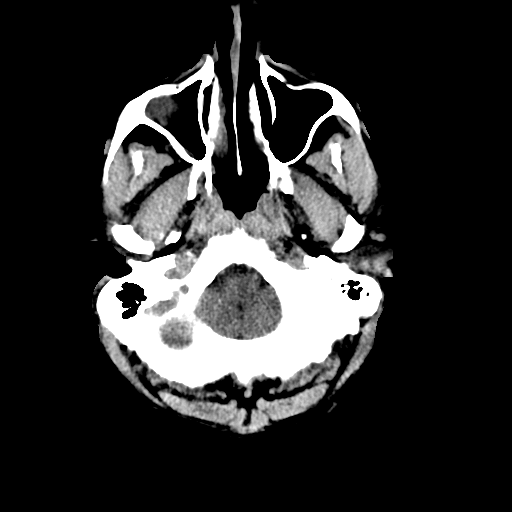
[im 8/35  brain]
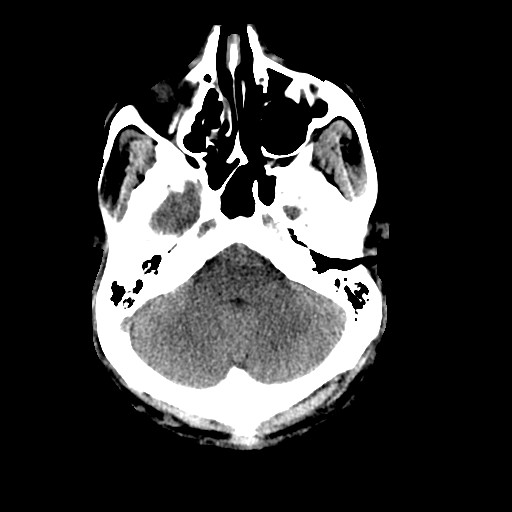
[im 13/35  brain]
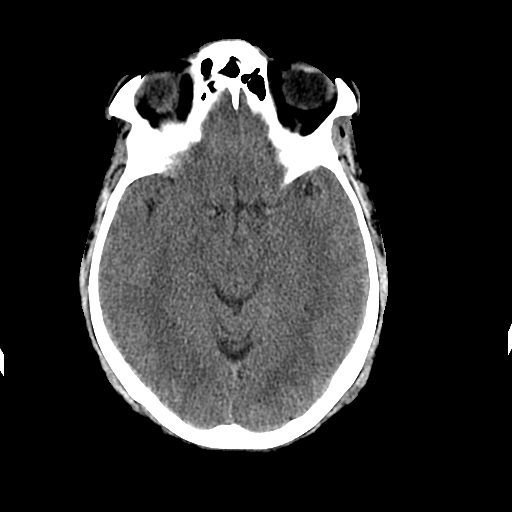
[im 15/35  brain]
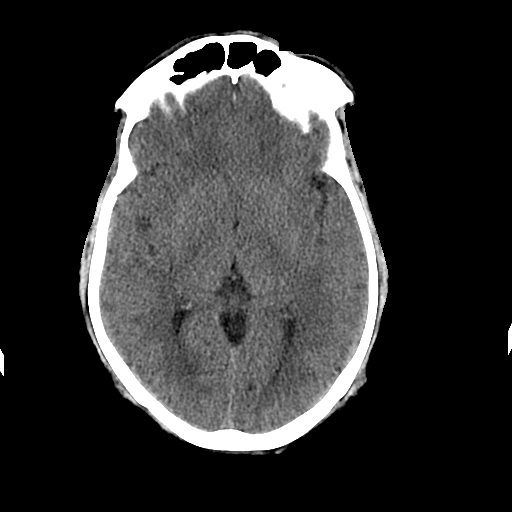
[im 15/35  bone]
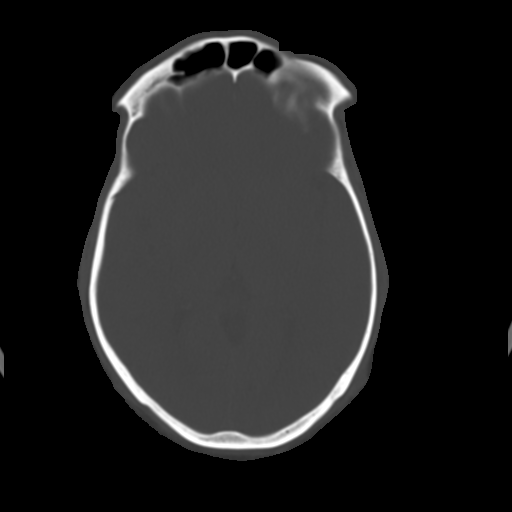
[im 18/35  brain]
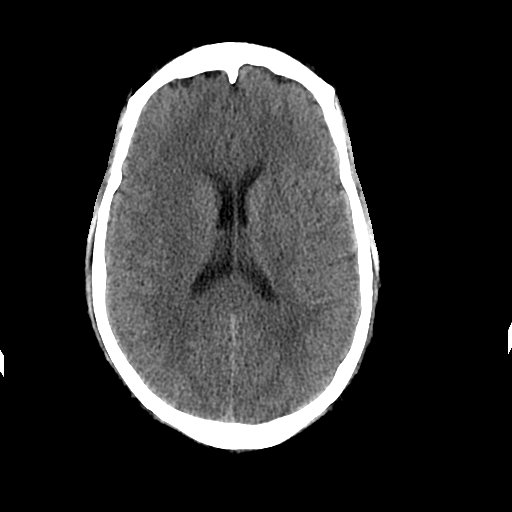
[im 20/35  brain]
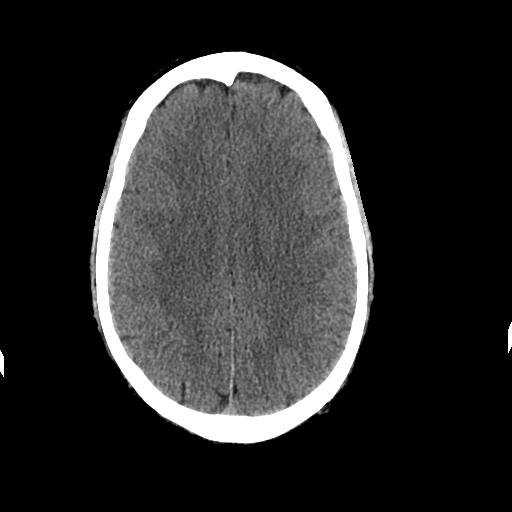
[im 22/35  brain]
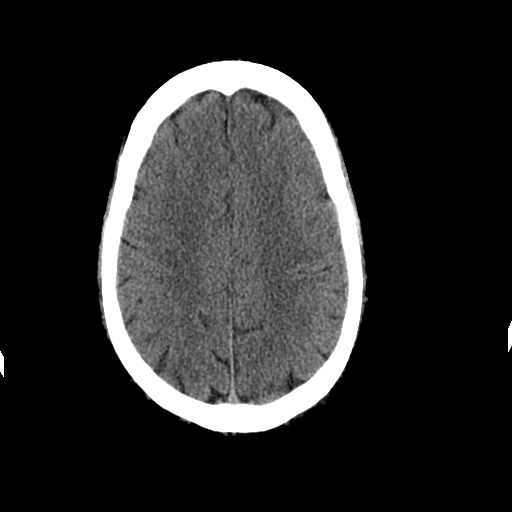
[im 27/35  brain]
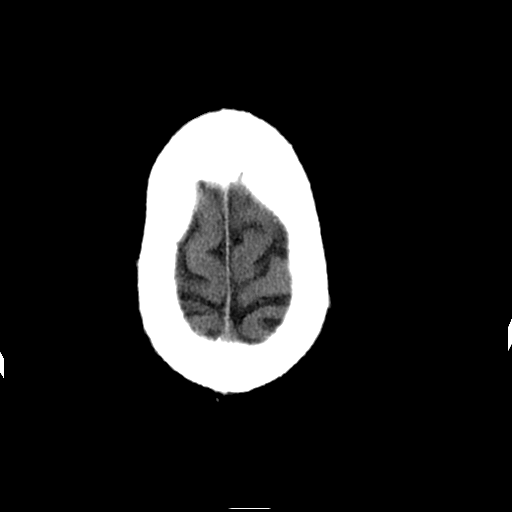
[im 27/35  bone]
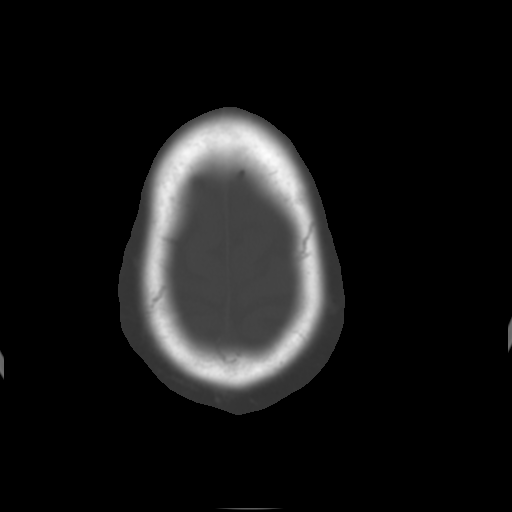
[im 30/35  brain]
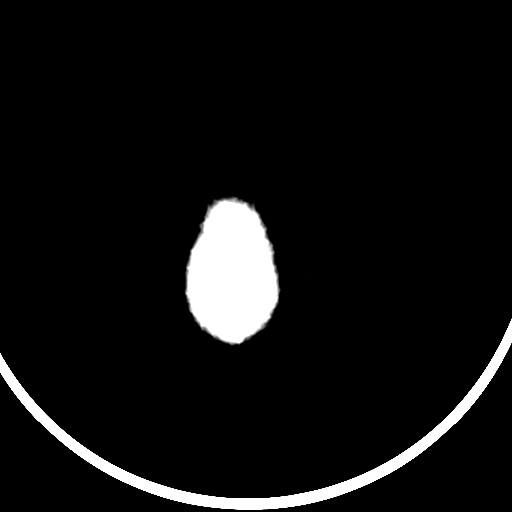
[im 32/35  brain]
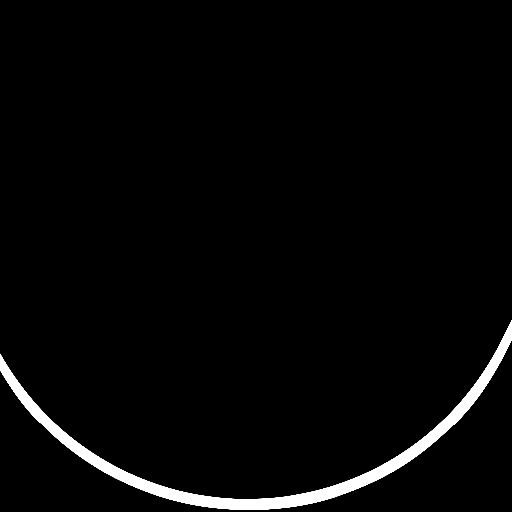

[Series 203: coronal st, idose (1) · coronal · 0.40mm/px · 3 of 78 slices shown]
[im 26/78  brain]
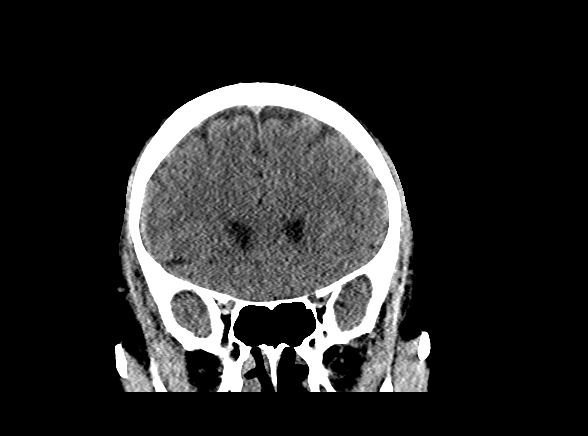
[im 35/78  brain]
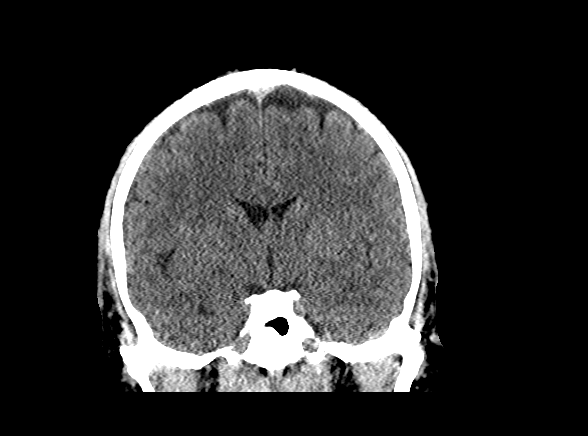
[im 43/78  brain]
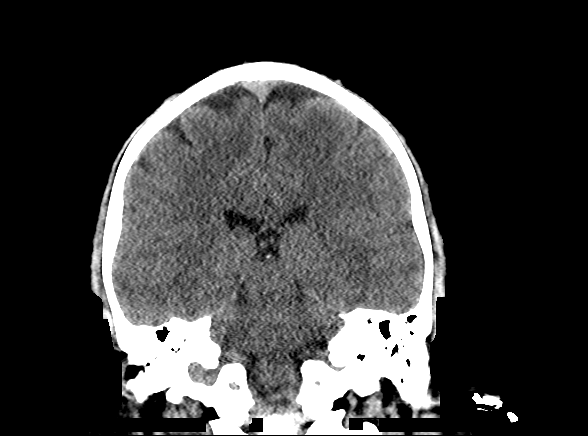

[Series 204: sagittal st, idose (1) · sagittal · 0.40mm/px · 3 of 78 slices shown]
[im 26/78  brain]
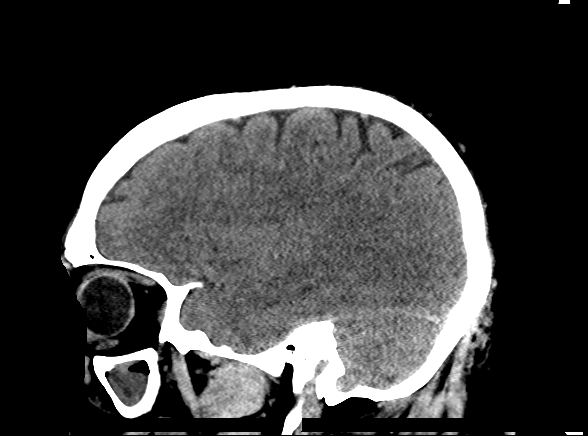
[im 39/78  brain]
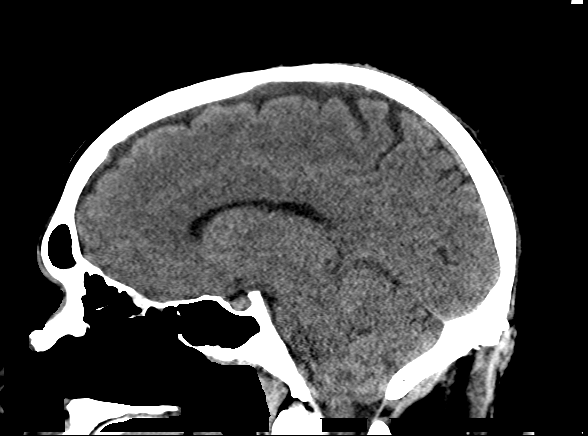
[im 52/78  brain]
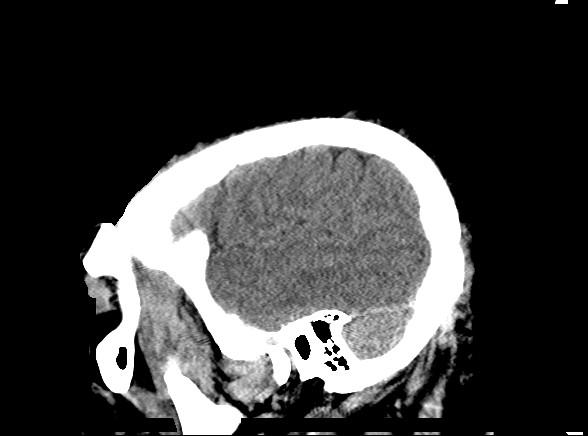

[17 of 47 positions shown; findings below may reference images not displayed]

FINDINGS: Brain: No evidence of acute infarction, hemorrhage, hydrocephalus,
extra-axial collection or mass lesion/mass effect.

Vascular: No hyperdense vessel or unexpected calcification.

Skull: Normal. Negative for fracture or focal lesion.

Sinuses/Orbits: There is mild mucoperiosteal thickening of paranasal
sinuses with partial opacification of the ethmoid air cells. No
air-fluid levels. Bilateral maxillary sinus retention cysts or
polyps noted. The mastoid air cells are clear.

Other: None
IMPRESSION: 1. No acute intracranial pathology.
2. Mild paranasal sinus disease.

## 2019-06-05 IMAGING — CR DG CHEST 2V
2 series · 2 of 2 positions shown · non-contrast
Comparison: April 05, 2016

CLINICAL DATA: Preoperative shoulder replacement surgery

EXAM:
CHEST - 2 VIEW

[w chest pa]
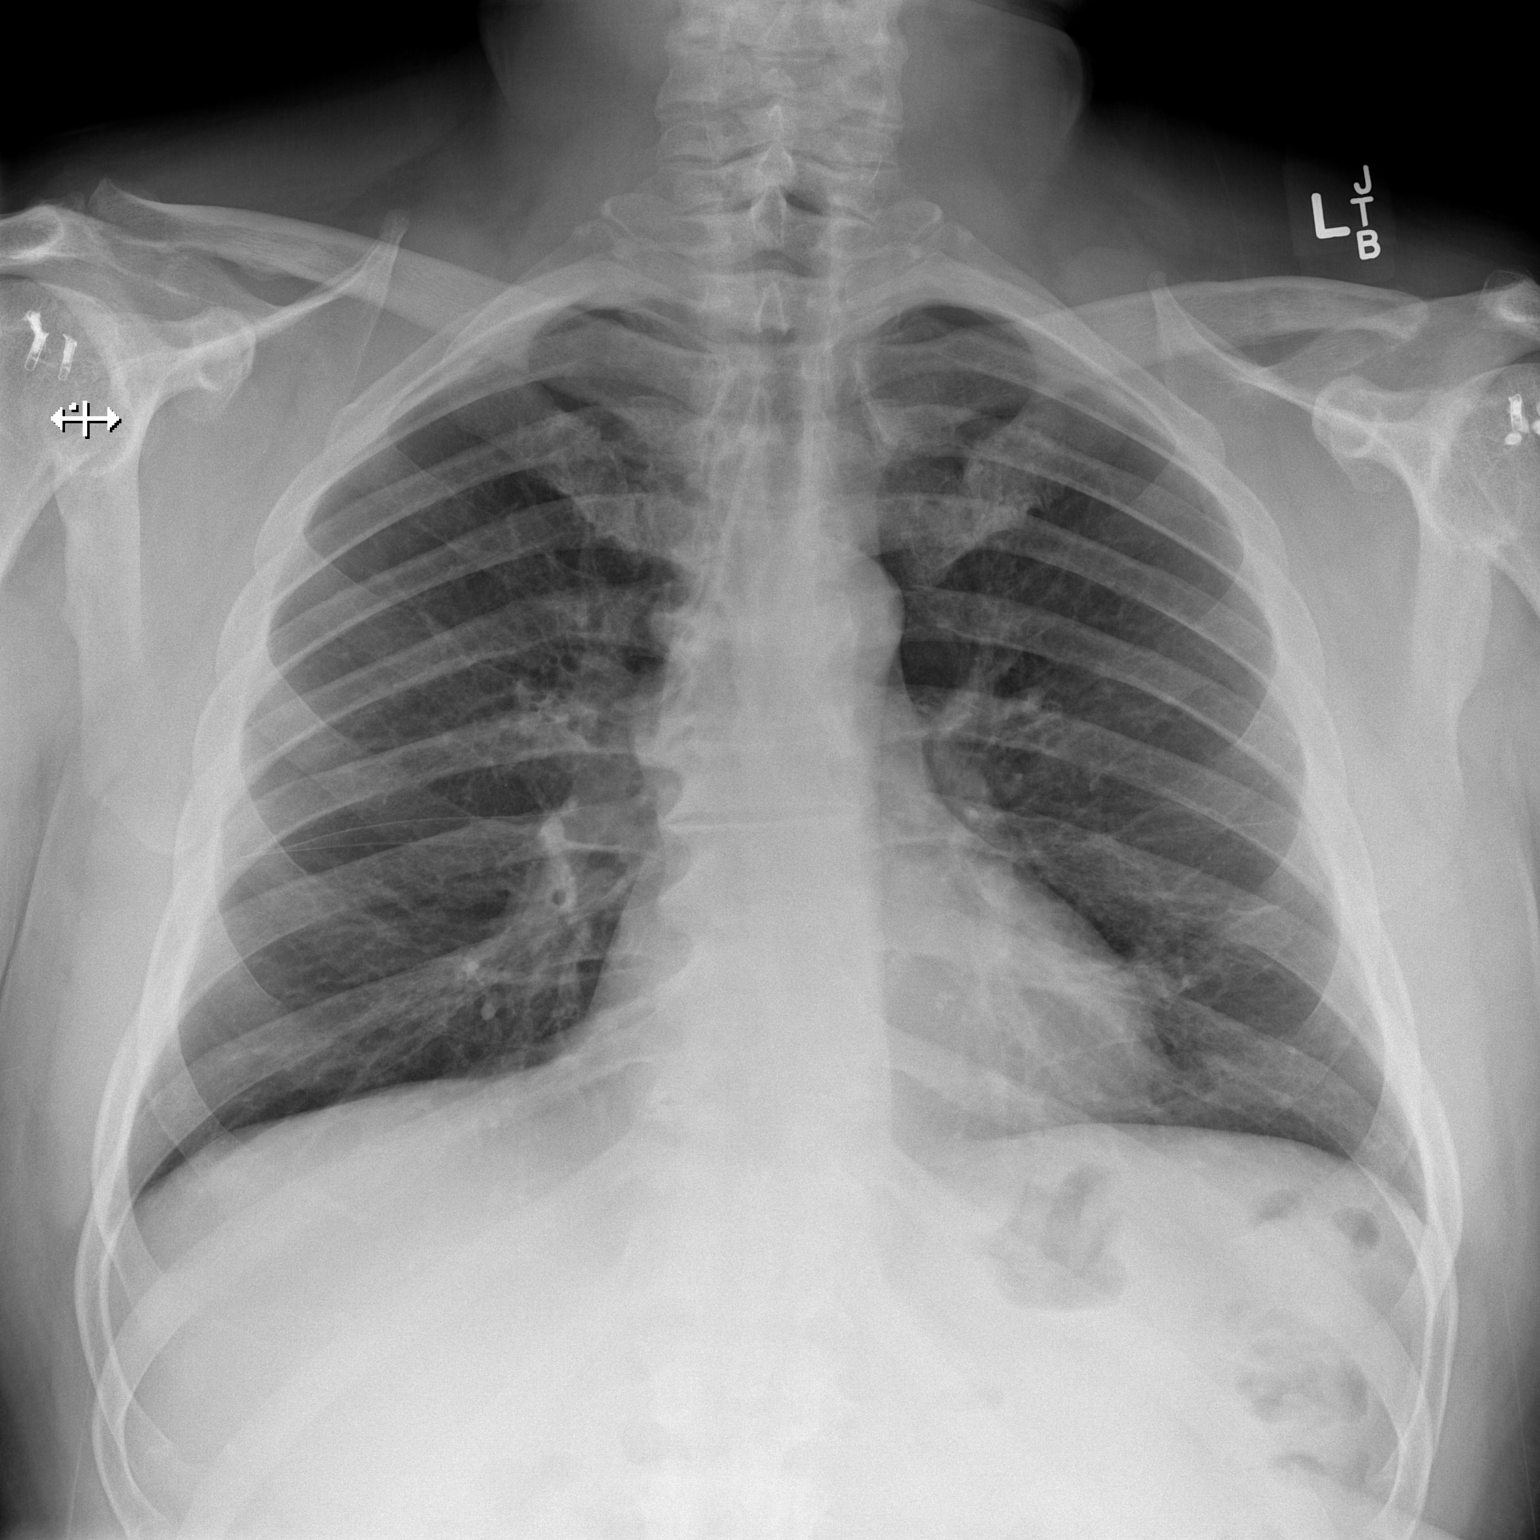

[w chest lat]
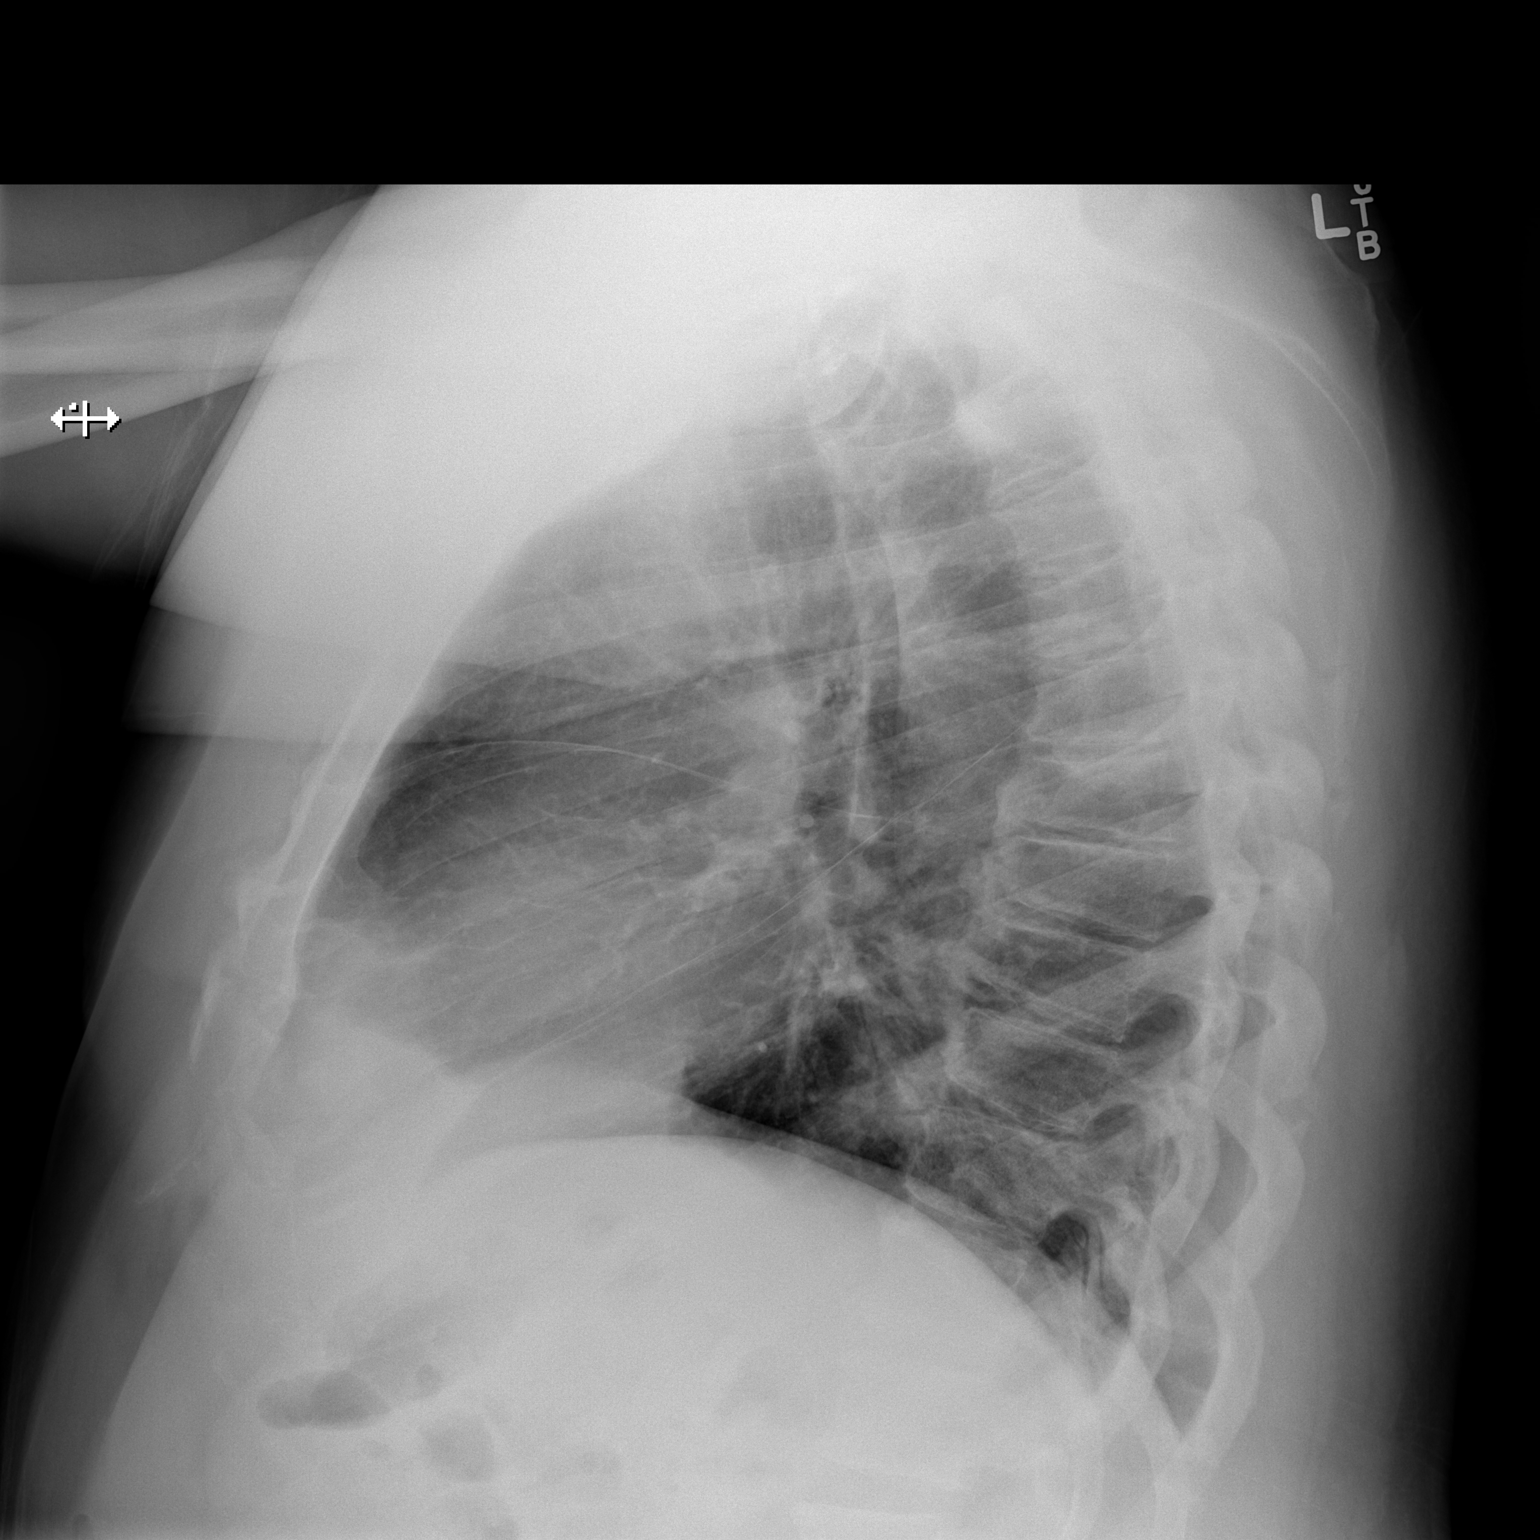

[2 of 2 positions shown; findings below may reference images not displayed]

FINDINGS: Postoperative changes noted in each shoulder. There is evidence of
old trauma involving the lateral left clavicle, stable.

There is slight atelectasis in the left lower lobe. Lungs elsewhere
are clear. Heart size and pulmonary vascularity are normal. No
adenopathy. There is degenerative change in the thoracic spine.
There is anterior wedging of several lower thoracic vertebral
bodies, stable.
IMPRESSION: Postoperative change in each shoulder. Old trauma involving distal
left clavicle. Anterior wedging of several thoracic vertebral
bodies, stable.

Slight left lower lobe atelectatic change. Lungs elsewhere clear.
Stable cardiac silhouette.
